# Patient Record
Sex: Female | Born: 1985 | Race: White | Hispanic: No | Marital: Married | Smoking: Never smoker
Health system: Southern US, Community
[De-identification: ages and names within clinical notes are randomized; demographics above are authoritative.]

## PROBLEM LIST (undated history)

## (undated) DIAGNOSIS — N915 Oligomenorrhea, unspecified: Secondary | ICD-10-CM

## (undated) DIAGNOSIS — A64 Unspecified sexually transmitted disease: Secondary | ICD-10-CM

## (undated) DIAGNOSIS — G43909 Migraine, unspecified, not intractable, without status migrainosus: Secondary | ICD-10-CM

## (undated) DIAGNOSIS — R87619 Unspecified abnormal cytological findings in specimens from cervix uteri: Secondary | ICD-10-CM

## (undated) HISTORY — DX: Oligomenorrhea, unspecified: N91.5

## (undated) HISTORY — DX: Unspecified abnormal cytological findings in specimens from cervix uteri: R87.619

## (undated) HISTORY — DX: Migraine, unspecified, not intractable, without status migrainosus: G43.909

## (undated) HISTORY — DX: Unspecified sexually transmitted disease: A64

## (undated) SURGERY — Surgical Case
Anesthesia: Epidural

---

## 2012-06-05 HISTORY — PX: COLPOSCOPY: SHX161

## 2013-03-31 ENCOUNTER — Ambulatory Visit (INDEPENDENT_AMBULATORY_CARE_PROVIDER_SITE_OTHER): Payer: BC Managed Care – PPO | Admitting: Certified Nurse Midwife

## 2013-03-31 ENCOUNTER — Encounter: Payer: Self-pay | Admitting: Certified Nurse Midwife

## 2013-03-31 VITALS — BP 100/64 | HR 72 | Resp 16 | Ht 61.25 in | Wt 142.0 lb

## 2013-03-31 DIAGNOSIS — Z309 Encounter for contraceptive management, unspecified: Secondary | ICD-10-CM

## 2013-03-31 DIAGNOSIS — Z01419 Encounter for gynecological examination (general) (routine) without abnormal findings: Secondary | ICD-10-CM

## 2013-03-31 DIAGNOSIS — Z0000 Encounter for general adult medical examination without abnormal findings: Secondary | ICD-10-CM

## 2013-03-31 DIAGNOSIS — R87619 Unspecified abnormal cytological findings in specimens from cervix uteri: Secondary | ICD-10-CM | POA: Insufficient documentation

## 2013-03-31 MED ORDER — DROSPIRENONE-ETHINYL ESTRADIOL 3-0.03 MG PO TABS: 1.0000 | ORAL_TABLET | Freq: Every day | ORAL | Status: DC

## 2013-03-31 NOTE — Patient Instructions (Signed)
General topics  Next pap or exam is  due in 1 year Take a Women's multivitamin Take 1200 mg. of calcium daily - prefer dietary If any concerns in interim to call back  Breast Self-Awareness Practicing breast self-awareness may pick up problems early, prevent significant medical complications, and possibly save your life. By practicing breast self-awareness, you can become familiar with how your breasts look and feel and if your breasts are changing. This allows you to notice changes early. It can also offer you some reassurance that your breast health is good. One way to learn what is normal for your breasts and whether your breasts are changing is to do a breast self-exam. If you find a lump or something that was not present in the past, it is best to contact your caregiver right away. Other findings that should be evaluated by your caregiver include nipple discharge, especially if it is bloody; skin changes or reddening; areas where the skin seems to be pulled in (retracted); or new lumps and bumps. Breast pain is seldom associated with cancer (malignancy), but should also be evaluated by a caregiver. BREAST SELF-EXAM The best time to examine your breasts is 5 7 days after your menstrual period is over.  ExitCare Patient Information 2013 ExitCare, LLC.   Exercise to Stay Healthy Exercise helps you become and stay healthy. EXERCISE IDEAS AND TIPS Choose exercises that:  You enjoy.  Fit into your day. You do not need to exercise really hard to be healthy. You can do exercises at a slow or medium level and stay healthy. You can:  Stretch before and after working out.  Try yoga, Pilates, or tai chi.  Lift weights.  Walk fast, swim, jog, run, climb stairs, bicycle, dance, or rollerskate.  Take aerobic classes. Exercises that burn about 150 calories:  Running 1  miles in 15 minutes.  Playing volleyball for 45 to 60 minutes.  Washing and waxing a car for 45 to 60  minutes.  Playing touch football for 45 minutes.  Walking 1  miles in 35 minutes.  Pushing a stroller 1  miles in 30 minutes.  Playing basketball for 30 minutes.  Raking leaves for 30 minutes.  Bicycling 5 miles in 30 minutes.  Walking 2 miles in 30 minutes.  Dancing for 30 minutes.  Shoveling snow for 15 minutes.  Swimming laps for 20 minutes.  Walking up stairs for 15 minutes.  Bicycling 4 miles in 15 minutes.  Gardening for 30 to 45 minutes.  Jumping rope for 15 minutes.  Washing windows or floors for 45 to 60 minutes. Document Released: 04/06/2010 Document Revised: 05/27/2011 Document Reviewed: 04/06/2010 ExitCare Patient Information 2013 ExitCare[employer] LLC.   Other topics ( that may be useful information):    Sexually Transmitted Disease Sexually transmitted disease (STD) refers to any infection that is passed from person to person during sexual activity. This may happen by way of saliva, semen, blood, vaginal mucus, or urine. Common STDs include:  Gonorrhea.  Chlamydia.  Syphilis.  HIV/AIDS.  Genital herpes.  Hepatitis B and C.  Trichomonas.  Human papillomavirus (HPV).  Pubic lice. CAUSES  An STD may be spread by bacteria, virus, or parasite. A person can get an STD by:  Sexual intercourse with an infected person.  Sharing sex toys with an infected person.  Sharing needles with an infected person.  Having intimate contact with the genitals, mouth, or rectal areas of an infected person. SYMPTOMS  Some people may not have any symptoms, but   they can still pass the infection to others. Different STDs have different symptoms. Symptoms include:  Painful or bloody urination.  Pain in the pelvis, abdomen, vagina, anus, throat, or eyes.  Skin rash, itching, irritation, growths, or sores (lesions). These usually occur in the genital or anal area.  Abnormal vaginal discharge.  Penile discharge in men.  Soft, flesh-colored skin growths in the  genital or anal area.  Fever.  Pain or bleeding during sexual intercourse.  Swollen glands in the groin area.  Yellow skin and eyes (jaundice). This is seen with hepatitis. DIAGNOSIS  To make a diagnosis, your caregiver may:  Take a medical history.  Perform a physical exam.  Take a specimen (culture) to be examined.  Examine a sample of discharge under a microscope.  Perform blood test TREATMENT   Chlamydia, gonorrhea, trichomonas, and syphilis can be cured with antibiotic medicine.  Genital herpes, hepatitis, and HIV can be treated, but not cured, with prescribed medicines. The medicines will lessen the symptoms.  Genital warts from HPV can be treated with medicine or by freezing, burning (electrocautery), or surgery. Warts may come back.  HPV is a virus and cannot be cured with medicine or surgery.However, abnormal areas may be followed very closely by your caregiver and may be removed from the cervix, vagina, or vulva through office procedures or surgery. If your diagnosis is confirmed, your recent sexual partners need treatment. This is true even if they are symptom-free or have a negative culture or evaluation. They should not have sex until their caregiver says it is okay. HOME CARE INSTRUCTIONS  All sexual partners should be informed, tested, and treated for all STDs.  Take your antibiotics as directed. Finish them even if you start to feel better.  Only take over-the-counter or prescription medicines for pain, discomfort, or fever as directed by your caregiver.  Rest.  Eat a balanced diet and drink enough fluids to keep your urine clear or pale yellow.  Do not have sex until treatment is completed and you have followed up with your caregiver. STDs should be checked after treatment.  Keep all follow-up appointments, Pap tests, and blood tests as directed by your caregiver.  Only use latex condoms and water-soluble lubricants during sexual activity. Do not use  petroleum jelly or oils.  Avoid alcohol and illegal drugs.  Get vaccinated for HPV and hepatitis. If you have not received these vaccines in the past, talk to your caregiver about whether one or both might be right for you.  Avoid risky sex practices that can break the skin. The only way to avoid getting an STD is to avoid all sexual activity.Latex condoms and dental dams (for oral sex) will help lessen the risk of getting an STD, but will not completely eliminate the risk. SEEK MEDICAL CARE IF:   You have a fever.  You have any new or worsening symptoms. Document Released: 05/25/2002 Document Revised: 05/27/2011 Document Reviewed: 06/01/2010 [facility] Patient Information 2013 [employer].    Domestic Abuse You are being battered or abused if someone close to you hits, pushes, or physically hurts you in any way. You also are being abused if you are forced into activities. You are being sexually abused if you are forced to have sexual contact of any kind. You are being emotionally abused if you are made to feel worthless or if you are constantly threatened. It is important to remember that help is available. No one has the right to abuse you. PREVENTION OF FURTHER  ABUSE  Learn the warning signs of danger. This varies with situations but may include: the use of alcohol, threats, isolation from friends and family, or forced sexual contact. Leave if you feel that violence is going to occur.  If you are attacked or beaten, report it to the police so the abuse is documented. You do not have to press charges. The police can protect you while you or the attackers are leaving. Get the officer's name and badge number and a copy of the report.  Find someone you can trust and tell them what is happening to you: your caregiver, a nurse, clergy member, close friend or family member. Feeling ashamed is natural, but remember that you have done nothing wrong. No one deserves abuse. Document Released:  03/01/2000 Document Revised: 05/27/2011 Document Reviewed: 05/10/2010 ExitCare Patient Information 2013 ExitCare[employer] LLC.    How Much is Too Much Alcohol? Drinking too much alcohol can cause injury, accidents, and health problems. These types of problems can include:   Car crashes.  Falls.  Family fighting (domestic violence).  Drowning.  Fights.  Injuries.  Burns.  Damage to certain organs.  Having a baby with birth defects. ONE DRINK CAN BE TOO MUCH WHEN YOU ARE:  Working.  Pregnant or breastfeeding.  Taking medicines. Ask your doctor.  Driving or planning to drive. If you or someone you know has a drinking problem, get help from a doctor.  Document Released: 12/29/2008 Document Revised: 05/27/2011 Document Reviewed: 12/29/2008 ExitCare Patient Information 2013 ExitCare, LLC.   Smoking Hazards Smoking cigarettes is extremely bad for your health. Tobacco smoke has over 200 known poisons in it. There are over 60 chemicals in tobacco smoke that cause cancer. Some of the chemicals found in cigarette smoke include:   Cyanide.  Benzene.  Formaldehyde.  Methanol (wood alcohol).  Acetylene (fuel used in welding torches).  Ammonia. Cigarette smoke also contains the poisonous gases nitrogen oxide and carbon monoxide.  Cigarette smokers have an increased risk of many serious medical problems and Smoking causes approximately:  90% of all lung cancer deaths in men.  80% of all lung cancer deaths in women.  90% of deaths from chronic obstructive lung disease. Compared with nonsmokers, smoking increases the risk of:  Coronary heart disease by 2 to 4 times.  Stroke by 2 to 4 times.  Men developing lung cancer by 23 times.  Women developing lung cancer by 13 times.  Dying from chronic obstructive lung diseases by 12 times.  . Smoking is the most preventable cause of death and disease in our society.  WHY IS SMOKING ADDICTIVE?  Nicotine is the chemical  agent in tobacco that is capable of causing addiction or dependence.  When you smoke and inhale, nicotine is absorbed rapidly into the bloodstream through your lungs. Nicotine absorbed through the lungs is capable of creating a powerful addiction. Both inhaled and non-inhaled nicotine may be addictive.  Addiction studies of cigarettes and spit tobacco show that addiction to nicotine occurs mainly during the teen years, when young people begin using tobacco products. WHAT ARE THE BENEFITS OF QUITTING?  There are many health benefits to quitting smoking.   Likelihood of developing cancer and heart disease decreases. Health improvements are seen almost immediately.  Blood pressure, pulse rate, and breathing patterns start returning to normal soon after quitting. QUITTING SMOKING   American Lung Association - 1-800-LUNGUSA  American Cancer Society - 1-800-ACS-2345 Document Released: 04/11/2004 Document Revised: 05/27/2011 Document Reviewed: 12/14/2008 ExitCare Patient Information 2013 ExitCare,   LLC.   Stress Management Stress is a state of physical or mental tension that often results from changes in your life or normal routine. Some common causes of stress are:  Death of a loved one.  Injuries or severe illnesses.  Getting fired or changing jobs.  Moving into a new home. Other causes may be:  Sexual problems.  Business or financial losses.  Taking on a large debt.  Regular conflict with someone at home or at work.  Constant tiredness from lack of sleep. It is not just bad things that are stressful. It may be stressful to:  Win the lottery.  Get married.  Buy a new car. The amount of stress that can be easily tolerated varies from person to person. Changes generally cause stress, regardless of the types of change. Too much stress can affect your health. It may lead to physical or emotional problems. Too little stress (boredom) may also become stressful. SUGGESTIONS TO  REDUCE STRESS:  Talk things over with your family and friends. It often is helpful to share your concerns and worries. If you feel your problem is serious, you may want to get help from a professional counselor.  Consider your problems one at a time instead of lumping them all together. Trying to take care of everything at once may seem impossible. List all the things you need to do and then start with the most important one. Set a goal to accomplish 2 or 3 things each day. If you expect to do too many in a single day you will naturally fail, causing you to feel even more stressed.  Do not use alcohol or drugs to relieve stress. Although you may feel better for a short time, they do not remove the problems that caused the stress. They can also be habit forming.  Exercise regularly - at least 3 times per week. Physical exercise can help to relieve that "uptight" feeling and will relax you.  The shortest distance between despair and hope is often a good night's sleep.  Go to bed and get up on time allowing yourself time for appointments without being rushed.  Take a short "time-out" period from any stressful situation that occurs during the day. Close your eyes and take some deep breaths. Starting with the muscles in your face, tense them, hold it for a few seconds, then relax. Repeat this with the muscles in your neck, shoulders, hand, stomach, back and legs.  Take good care of yourself. Eat a balanced diet and get plenty of rest.  Schedule time for having fun. Take a break from your daily routine to relax. HOME CARE INSTRUCTIONS   Call if you feel overwhelmed by your problems and feel you can no longer manage them on your own.  Return immediately if you feel like hurting yourself or someone else. Document Released: 08/28/2000 Document Revised: 05/27/2011 Document Reviewed: 04/20/2007 ExitCare Patient Information 2013 ExitCare, LLC.   

## 2013-03-31 NOTE — Progress Notes (Signed)
28 y.o. G0P0000 Single Caucasian Fe here for annual exam.  Periods normal, no issues. Contraception working well. No STD concerns or testing needed. 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 Patient had abnormal in 2013 with pap to be done in one year. Pap repeated with ASCUS + HPVHR with colpo which ECC only with negative results. Patient will be moving and aware pap needs to be done 2/15, so will do today. Denies vaginal concerns or discharge. No other health issues today.  Patient's last menstrual period was 03/21/2013.          Sexually active: yes  The current method of family planning is OCP (estrogen/progesterone).    Exercising: yes  cardio Smoker:  no  Health Maintenance: Pap:  7/13, pap,2/14,77mth pap 10/14 , no aex since 7/13 it looks like MMG:  none Colonoscopy:  none BMD:   none TDaP:  Up to date Labs: none Self breast exam: done occ   reports that she has never smoked. She does not have any smokeless tobacco history on file. She reports that she drinks alcohol. She reports that she does not use illicit drugs.  Past Medical History  Diagnosis Date  . Migraines   . Abnormal Pap smear of cervix     2013,2014 HPV pos  . STD (sexually transmitted disease)     HPV    Past Surgical History  Procedure Laterality Date  . Colposcopy  2013    Current Outpatient Prescriptions  Medication Sig Dispense Refill  . drospirenone-ethinyl estradiol (YASMIN,ZARAH,SYEDA) 3-0.03 MG tablet Take 1 tablet by mouth daily.       No current facility-administered medications for this visit.    Family History  Problem Relation Age of Onset  . Cancer Mother     squamous cell carcinoma on her tongue  . Diabetes Paternal Uncle   . Diabetes Paternal Grandmother   . Stroke Paternal Grandmother   . Diabetes Paternal Grandfather     ROS:  Pertinent items are noted in HPI.  Otherwise, a comprehensive ROS was negative.  Exam:   BP 100/64  Pulse 72  Resp 16  Ht 5' 1.25" (1.556 m)   Wt 142 lb (64.411 kg)  BMI 26.60 kg/m2  LMP 03/21/2013 Height: 5' 1.25" (155.6 cm)  Ht Readings from Last 3 Encounters:  03/31/13 5' 1.25" (1.556 m)    General appearance: alert, cooperative and appears stated age Head: Normocephalic, without obvious abnormality, atraumatic Neck: no adenopathy, supple, symmetrical, trachea midline and thyroid normal to inspection and palpation Lungs: clear to auscultation bilaterally Breasts: normal appearance, no masses or tenderness, No nipple retraction or dimpling, No nipple discharge or bleeding, No axillary or supraclavicular adenopathy, Taught monthly breast self examination Heart: regular rate and rhythm Abdomen: soft, non-tender; no masses,  no organomegaly Extremities: extremities normal, atraumatic, no cyanosis or edema Skin: Skin color, texture, turgor normal. No rashes or lesions Lymph nodes: Cervical, supraclavicular, and axillary nodes normal. No abnormal inguinal nodes palpated Neurologic: Grossly normal   Pelvic: External genitalia:  no lesions              Urethra:  normal appearing urethra with no masses, tenderness or lesions              Bartholin's and Skene's: normal                 Vagina: normal appearing vagina with normal color and discharge, no lesions  Cervix: normal appearance, non tender              Pap taken: yes Bimanual Exam:  Uterus:  normal size, contour, position, consistency, mobility, non-tender and anteflexed              Adnexa: normal adnexa and no mass, fullness, tenderness               Rectovaginal: Confirms               Anus:  normal sphincter tone, no lesions  A:  Well Woman with normal exam  Contraception OCP desired  History of abnormal pap ASCUS + HPVHR, Colpo negative ECC   P:   Reviewed health and wellness pertinent to exam  Rx Yasmin see order  Pap smear as per guidelines   pap smear taken today with HPV reflex if negative repeat in one year if no per results patient  aware  counseled on breast self exam, STD prevention, HIV risk factors and prevention, use and side effects of OCP's, adequate intake of calcium and vitamin D, diet and exercise  return annually or prn  An After Visit Summary was printed and given to the patient.

## 2013-04-01 LAB — IPS PAP TEST WITH REFLEX TO HPV

## 2013-04-02 NOTE — Progress Notes (Signed)
Reviewed personally.  M. Suzanne Marybeth Dandy, MD.  

## 2013-10-15 ENCOUNTER — Ambulatory Visit (INDEPENDENT_AMBULATORY_CARE_PROVIDER_SITE_OTHER): Payer: BC Managed Care – PPO | Admitting: Physician Assistant

## 2013-10-15 VITALS — BP 118/72 | HR 84 | Temp 98.5°F | Resp 16 | Ht 61.25 in | Wt 146.4 lb

## 2013-10-15 DIAGNOSIS — Z111 Encounter for screening for respiratory tuberculosis: Secondary | ICD-10-CM

## 2013-10-15 DIAGNOSIS — Z23 Encounter for immunization: Secondary | ICD-10-CM

## 2013-10-15 NOTE — Progress Notes (Signed)

## 2013-10-15 NOTE — Progress Notes (Signed)
   Subjective:    Patient ID: Amber Graves, female    DOB: December 24, 1985, 28 y.o.   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  She is UTD on all other immunizations. Patient is heathy with no other concerns today.   See TB screening questionnaire.   Review of Systems  Constitutional: Negative for fever and chills.  Respiratory: Negative for cough.   Gastrointestinal: Negative for nausea and vomiting.  Neurological: Negative for headaches.       Objective:   Physical Exam  Constitutional: She is oriented to person, place, and time. She appears well-developed and well-nourished.  HENT:  Head: Normocephalic and atraumatic.  Right Ear: External ear normal.  Left Ear: External ear normal.  Eyes: Conjunctivae are normal.  Neck: Normal range of motion.  Cardiovascular: Normal rate.   Pulmonary/Chest: Effort normal.  Neurological: She is alert and oriented to person, place, and time.  Psychiatric: She has a normal mood and affect. Her behavior is normal. Judgment and thought content normal.          Assessment & Plan:  Screening for tuberculosis - Plan: TB Skin Test  Need for prophylactic vaccination and inoculation against viral hepatitis - Plan: Hepatitis A vaccine adult IM  TB test placed.  Hep A #1 given Follow up as needed.

## 2013-10-17 ENCOUNTER — Telehealth: Payer: Self-pay | Admitting: *Deleted

## 2013-10-17 ENCOUNTER — Ambulatory Visit (INDEPENDENT_AMBULATORY_CARE_PROVIDER_SITE_OTHER): Payer: BC Managed Care – PPO | Admitting: *Deleted

## 2013-10-17 DIAGNOSIS — Z111 Encounter for screening for respiratory tuberculosis: Secondary | ICD-10-CM

## 2013-10-17 LAB — TB SKIN TEST
Induration: 0 mm
TB Skin Test: NEGATIVE

## 2014-01-12 ENCOUNTER — Ambulatory Visit (INDEPENDENT_AMBULATORY_CARE_PROVIDER_SITE_OTHER): Payer: Self-pay | Admitting: Family Medicine

## 2014-01-12 VITALS — BP 118/80 | HR 95 | Temp 99.0°F | Resp 16 | Ht 61.25 in | Wt 146.0 lb

## 2014-01-12 DIAGNOSIS — Z111 Encounter for screening for respiratory tuberculosis: Secondary | ICD-10-CM

## 2014-01-12 NOTE — Progress Notes (Signed)

## 2014-01-12 NOTE — Progress Notes (Signed)
   Subjective:    Patient ID: Amber Graves, female    DOB: May 07, 1985, 28 y.o.   MRN: [id number] This chart was scribed for Elvina SidleKurt Deiona Hooper, MD by Jolene Provostobert Halas, Medical Scribe. This patient was seen in Room 10 and the patient's care was started at 8:41 AM.  HPI HPI Comments: [place] is a 28 y.o. female just needing a TB test.   She does not need a doctors visit.   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

## 2014-01-14 ENCOUNTER — Ambulatory Visit (INDEPENDENT_AMBULATORY_CARE_PROVIDER_SITE_OTHER): Payer: Self-pay | Admitting: Radiology

## 2014-01-14 DIAGNOSIS — Z111 Encounter for screening for respiratory tuberculosis: Secondary | ICD-10-CM

## 2014-01-14 LAB — TB SKIN TEST
Induration: 0 mm
TB Skin Test: NEGATIVE

## 2014-04-01 ENCOUNTER — Telehealth: Payer: Self-pay | Admitting: Certified Nurse Midwife

## 2014-04-01 ENCOUNTER — Other Ambulatory Visit: Payer: Self-pay | Admitting: Certified Nurse Midwife

## 2014-04-01 NOTE — Telephone Encounter (Signed)
Patient is returning a call to Reina. °

## 2014-04-01 NOTE — Telephone Encounter (Signed)
Medication refill request: Yasmin  Last AEX:  03/31/13 Next AEX: Not scheduled  Last MMG (if hormonal medication request): N/A Refill authorized: 03/31/13 #1pack/12RF. Today #1pack/0R?  LM for pt to call back to schedule AEX.

## 2014-04-01 NOTE — Telephone Encounter (Signed)
Called pt back to schedule AEX. She states she will call back in the beginning of February bc she in between health insurances.  - Advised pt that Mrs Eunice BlaseDebbie sent 1 month supply and that she just needs to schedule her AEX before that. - Pt verbalized understanding.  Mrs Newt MinionDebbie FYI - Encounter closed.

## 2014-04-01 NOTE — Telephone Encounter (Signed)
Yes no refill

## 2014-04-27 ENCOUNTER — Other Ambulatory Visit: Payer: Self-pay | Admitting: *Deleted

## 2014-04-27 MED ORDER — DROSPIRENONE-ETHINYL ESTRADIOL 3-0.03 MG PO TABS: 1.0000 | ORAL_TABLET | Freq: Every day | ORAL | Status: DC

## 2014-04-27 NOTE — Telephone Encounter (Signed)
Incoming [facility] from [facility] requesting Ocella 3-0.03 mg tab  Medication refill request: Ocella Last AEX:  03/31/13 Next AEX: Not scheduled  Last MMG (if hormonal medication request): none Refill authorized: 04/01/14 #28/0R. Today: denied?   Dion BodyReina C Beltran, CMA at 04/01/2014 4:46 PM     Status: Signed       Expand All Collapse All   Called pt back to schedule AEX. She states she will call back in the beginning of February bc she in between health insurances.  - Advised pt that Mrs Eunice BlaseDebbie sent 1 month supply and that she just needs to schedule her AEX before that. - Pt verbalized understanding.  Mrs Newt MinionDebbie FYI - Encounter closed.

## 2014-04-28 NOTE — Telephone Encounter (Signed)
Fax faxed back to [facility] stating that #28/0 rfs was sent electronically to pharmacy.

## 2015-07-04 ENCOUNTER — Encounter: Payer: Self-pay | Admitting: Emergency Medicine

## 2015-07-04 ENCOUNTER — Emergency Department
Admission: EM | Admit: 2015-07-04 | Discharge: 2015-07-04 | Disposition: A | Discharge status: Home / Self Care | Payer: Managed Care, Other (non HMO) | Attending: Emergency Medicine | Admitting: Emergency Medicine

## 2015-07-04 ENCOUNTER — Emergency Department: Payer: Managed Care, Other (non HMO)

## 2015-07-04 DIAGNOSIS — R1030 Lower abdominal pain, unspecified: Secondary | ICD-10-CM | POA: Diagnosis present

## 2015-07-04 DIAGNOSIS — Z79899 Other long term (current) drug therapy: Secondary | ICD-10-CM | POA: Diagnosis not present

## 2015-07-04 DIAGNOSIS — I880 Nonspecific mesenteric lymphadenitis: Secondary | ICD-10-CM | POA: Diagnosis not present

## 2015-07-04 LAB — COMPREHENSIVE METABOLIC PANEL
ALT: 31 U/L (ref 14–54)
AST: 30 U/L (ref 15–41)
Albumin: 4.2 g/dL (ref 3.5–5.0)
Alkaline Phosphatase: 44 U/L (ref 38–126)
Anion gap: 8 (ref 5–15)
BUN: 7 mg/dL (ref 6–20)
CHLORIDE: 103 mmol/L (ref 101–111)
CO2: 29 mmol/L (ref 22–32)
CREATININE: 0.6 mg/dL (ref 0.44–1.00)
Calcium: 8.9 mg/dL (ref 8.9–10.3)
Glucose, Bld: 95 mg/dL (ref 65–99)
Potassium: 3.4 mmol/L — ABNORMAL LOW (ref 3.5–5.1)
Sodium: 140 mmol/L (ref 135–145)
TOTAL PROTEIN: 7 g/dL (ref 6.5–8.1)
Total Bilirubin: 1 mg/dL (ref 0.3–1.2)

## 2015-07-04 LAB — URINALYSIS COMPLETE WITH MICROSCOPIC (ARMC ONLY)
BILIRUBIN URINE: NEGATIVE
Glucose, UA: NEGATIVE mg/dL
HGB URINE DIPSTICK: NEGATIVE
LEUKOCYTES UA: NEGATIVE
NITRITE: NEGATIVE
PH: 6 (ref 5.0–8.0)
Protein, ur: NEGATIVE mg/dL
SPECIFIC GRAVITY, URINE: 1.005 (ref 1.005–1.030)

## 2015-07-04 LAB — CBC
HCT: 40.6 % (ref 35.0–47.0)
Hemoglobin: 14 g/dL (ref 12.0–16.0)
MCH: 31.3 pg (ref 26.0–34.0)
MCHC: 34.5 g/dL (ref 32.0–36.0)
MCV: 90.7 fL (ref 80.0–100.0)
PLATELETS: 172 10*3/uL (ref 150–440)
RBC: 4.48 MIL/uL (ref 3.80–5.20)
RDW: 12.2 % (ref 11.5–14.5)
WBC: 4.3 10*3/uL (ref 3.6–11.0)

## 2015-07-04 LAB — POCT PREGNANCY, URINE: Preg Test, Ur: NEGATIVE

## 2015-07-04 LAB — LIPASE, BLOOD: LIPASE: 14 U/L (ref 11–51)

## 2015-07-04 MED ORDER — ONDANSETRON HCL 4 MG PO TABS: 4.0000 mg | ORAL_TABLET | Freq: Three times a day (TID) | ORAL | Status: DC | PRN

## 2015-07-04 MED ORDER — IOPAMIDOL (ISOVUE-300) INJECTION 61%
100.0000 mL | Freq: Once | INTRAVENOUS | Status: AC | PRN
  Administered 2015-07-04: 100 mL via INTRAVENOUS
  Filled 2015-07-04: qty 100

## 2015-07-04 MED ORDER — DIATRIZOATE MEGLUMINE & SODIUM 66-10 % PO SOLN
15.0000 mL | Freq: Once | ORAL | Status: AC
  Administered 2015-07-04: 15 mL via ORAL

## 2015-07-04 MED ORDER — DICYCLOMINE HCL 20 MG PO TABS: 20.0000 mg | ORAL_TABLET | Freq: Three times a day (TID) | ORAL | Status: DC | PRN

## 2015-07-04 NOTE — ED Notes (Signed)
Patient transported to CT via stretcher.

## 2015-07-04 NOTE — ED Notes (Signed)
Discharge instructions reviewed with patient. Patient verbalized understanding. Patient ambulated to lobby without difficulty.   

## 2015-07-04 NOTE — ED Notes (Signed)
Patient ambulatory to triage with steady gait, without difficulty or distress noted; pt reports right lower abd pain since Sunday accomp by N/V/D and bloating

## 2015-07-04 NOTE — ED Notes (Signed)
Pt reports RLQ abd pain. Reports pain started as generalized abd pain starting Sunday that then focused in her RLQ. Had N/V/D Sunday that was just N/V the past two days, no N/V today, but lack of appetite today. No abd tenderness, pt in NAD. Pt hypertensive in triage and in room, reports no hx of HTN.

## 2015-07-04 NOTE — ED Provider Notes (Signed)
Time Seen: Approximately 2030 I have reviewed the triage notes  Chief Complaint: Abdominal Pain   History of Present Illness: Amber Graves is a 30 y.o. female who presents with a 2 day history of some nausea, vomiting and loose stool. She states her symptoms started on Sunday and she started developing some diffuse lower abdominal pain. She states the nausea and vomiting have decreased along with the diarrhea though she still has a decreased appetite. Had some pain in the periumbilical to right lower quadrant region. She denies any fever or back pain. She denies any dysuria, hematuria or urinary frequency. She has not traveled recently is not currently on antibiotic therapy.  Past Medical History  Diagnosis Date  . Migraines   . Abnormal Pap smear of cervix     09-17-2011 ASCUS +HPV,12/2012 ASC-US  . STD (sexually transmitted disease)     HPV    Patient Active Problem List   Diagnosis Date Noted  . Abnormal Pap smear of cervix 03/31/2013    Class: History of    Past Surgical History  Procedure Laterality Date  . Colposcopy  06-05-2012    ECC neg    Past Surgical History  Procedure Laterality Date  . Colposcopy  06-05-2012    ECC neg    Current Outpatient Rx  Name  Route  Sig  Dispense  Refill  . dicyclomine (BENTYL) 20 MG tablet   Oral   Take 1 tablet (20 mg total) by mouth 3 (three) times daily as needed for spasms.   30 tablet   0   . drospirenone-ethinyl estradiol (OCELLA) 3-0.03 MG tablet   Oral   Take 1 tablet by mouth daily.   28 tablet   0   . ondansetron (ZOFRAN) 4 MG tablet   Oral   Take 1 tablet (4 mg total) by mouth every 8 (eight) hours as needed for nausea or vomiting.   21 tablet   0     Allergies:  Review of patient's allergies indicates no known allergies.  Family History: Family History  Problem Relation Age of Onset  . Cancer Mother     squamous cell carcinoma on her tongue  . Diabetes Paternal Uncle   . Diabetes Paternal  Grandmother   . Stroke Paternal Grandmother   . Diabetes Paternal Grandfather     Social History: Social History  Substance Use Topics  . Smoking status: Never Smoker   . Smokeless tobacco: Never Used  . Alcohol Use: 0.0 - 0.5 oz/week    0-1 drink(s) per week     Review of Systems:   10 point review of systems was performed and was otherwise negative:  Constitutional: No fever Eyes: No visual disturbances ENT: No sore throat, ear pain Cardiac: No chest pain Respiratory: No shortness of breath, wheezing, or stridor Abdomen: She points mainly toward her right lower quadrant area and states the vomiting and diarrhea have improved. She denies any lower hematochezia  Endocrine: No weight loss, No night sweats Extremities: No peripheral edema, cyanosis Skin: No rashes, easy bruising Neurologic: No focal weakness, trouble with speech or swollowing Urologic: No dysuria, Hematuria, or urinary frequency   Physical Exam:  ED Triage Vitals  Enc Vitals Group     BP 07/04/15 1926 116/100 mmHg     Pulse Rate 07/04/15 1926 93     Resp 07/04/15 1926 20     Temp 07/04/15 1926 98.1 F (36.7 C)     Temp Source 07/04/15 1926 Oral  SpO2 07/04/15 1926 100 %     Weight 07/04/15 1926 160 lb (72.576 kg)     Height 07/04/15 1926  (1.549 m)     Head Cir --      Peak Flow --      Pain Score 07/04/15 1926 3     Pain Loc --      Pain Edu? --      Excl. in GC? --     General: Awake , Alert , and Oriented times 3; GCS 15 Head: Normal cephalic , atraumatic Eyes: Pupils equal , round, reactive to light Nose/Throat: No nasal drainage, patent upper airway without erythema or exudate.  Neck: Supple, Full range of motion, No anterior adenopathy or palpable thyroid masses Lungs: Clear to ascultation without wheezes , rhonchi, or rales Heart: Regular rate, regular rhythm without murmurs , gallops , or rubs Abdomen: Mild tenderness in the right lower quadrant without rebound, guarding , or  rigidity; bowel sounds positive and symmetric in all 4 quadrants. No organomegaly .        Extremities: 2 plus symmetric pulses. No edema, clubbing or cyanosis Neurologic: normal ambulation, Motor symmetric without deficits, sensory intact Skin: warm, dry, no rashes   Labs:   All laboratory work was reviewed including any pertinent negatives or positives listed below:  Labs Reviewed  COMPREHENSIVE METABOLIC PANEL - Abnormal; Notable for the following:    Potassium 3.4 (*)    All other components within normal limits  URINALYSIS COMPLETEWITH MICROSCOPIC ([facility] ONLY) - Abnormal; Notable for the following:    Color, Urine YELLOW (*)    APPearance CLEAR (*)    Ketones, ur TRACE (*)    Bacteria, UA RARE (*)    Squamous Epithelial / LPF 0-5 (*)    All other components within normal limits  LIPASE, BLOOD  CBC  POCT PREGNANCY, URINE   review of laboratory work showed no significant abnormalities  Radiology:   CLINICAL DATA: Acute right lower quadrant abdominal pain.  EXAM: CT ABDOMEN AND PELVIS WITH CONTRAST  TECHNIQUE: Multidetector CT imaging of the abdomen and pelvis was performed using the standard protocol following bolus administration of intravenous contrast.  CONTRAST: ISOVUE-300 IOPAMIDOL (ISOVUE-300) INJECTION 61%  COMPARISON: None.  FINDINGS: Visualized lung bases are unremarkable. No significant osseous abnormality is noted.  No gallstones are noted. The liver, spleen and pancreas are unremarkable. There is no evidence of bowel obstruction. The visualized appendix appears normal. Urinary bladder appears normal. Uterus and ovaries are unremarkable. Mild amount of free fluid is noted in the pelvis which most likely is physiologic. Urinary bladder appears normal. Mildly enlarged mesenteric lymph nodes are noted, most likely inflammatory or reactive in etiology.  IMPRESSION: Mildly enlarged mesenteric lymph nodes are noted with mild inflammatory  stranding present most consistent with inflammatory or reactive etiology. No other significant abnormality seen in the abdomen or pelvis.    I personally reviewed the radiologic studies    ED Course:  Patient's stay here was uneventful and does not appear that she has a surgical abdomen at this time. Her CAT scan is negative and I felt we could stop the pursuit for acute appendicitis. They do see some mesenteric adenitis which would be consistent with her viral gastroenteritis history.   Assessment: * Mesenteric adenitis  Final Clinical Impression:  * Final diagnoses:  Mesenteric adenitis     Plan:  Outpatient management Patient was advised to return immediately if condition worsens. Patient was advised to follow up with  their primary care physician or other specialized physicians involved in their outpatient care. The patient and/or family member/power of attorney had laboratory results reviewed at the bedside. All questions and concerns were addressed and appropriate discharge instructions were distributed by the nursing staff.            Jennye Moccasin, MD 07/04/15 [phone]

## 2015-08-07 ENCOUNTER — Ambulatory Visit (INDEPENDENT_AMBULATORY_CARE_PROVIDER_SITE_OTHER): Payer: Managed Care, Other (non HMO) | Admitting: Family Medicine

## 2015-08-07 ENCOUNTER — Encounter: Payer: Self-pay | Admitting: Family Medicine

## 2015-08-07 VITALS — BP 100/60 | HR 80 | Ht 61.0 in | Wt 161.0 lb

## 2015-08-07 DIAGNOSIS — N938 Other specified abnormal uterine and vaginal bleeding: Secondary | ICD-10-CM | POA: Diagnosis not present

## 2015-08-07 NOTE — Patient Instructions (Signed)

## 2015-08-07 NOTE — Progress Notes (Signed)
Name: Amber ErikssonBrittany Watlington   MRN: [id number]    DOB: 1985-11-24   Date:08/07/2015       Progress Note  Subjective  Chief Complaint  Chief Complaint  Patient presents with  . Establish Care  . abnormal menstrual cycle    been off birth control x 6 months- having irregular periods- referral to GYN    HPI Comments: Patient present for establish primary care physician.    No problem-specific assessment & plan notes found for this encounter.   Past Medical History  Diagnosis Date  . Migraines   . Abnormal Pap smear of cervix     09-17-2011 ASCUS +HPV,12/2012 ASC-US  . STD (sexually transmitted disease)     HPV    Past Surgical History  Procedure Laterality Date  . Colposcopy  06-05-2012    ECC neg    Family History  Problem Relation Age of Onset  . Cancer Mother     squamous cell carcinoma on her tongue  . Diabetes Paternal Uncle   . Diabetes Paternal Grandmother   . Stroke Paternal Grandmother   . Diabetes Paternal Grandfather     Social History   Social History  . Marital Status: Single    Spouse Name: N/A  . Number of Children: N/A  . Years of Education: N/A   Occupational History  . Not on file.   Social History Main Topics  . Smoking status: Never Smoker   . Smokeless tobacco: Never Used  . Alcohol Use: 0.0 - 0.5 oz/week    0-1 drink(s) per week  . Drug Use: No  . Sexual Activity:    Partners: Male    Birth Control/ Protection: Pill   Other Topics Concern  . Not on file   Social History Narrative    No Known Allergies   Review of Systems  Constitutional: Negative for fever, chills, weight loss and malaise/fatigue.  HENT: Negative for ear discharge, ear pain and sore throat.   Eyes: Negative for blurred vision.  Respiratory: Negative for cough, sputum production, shortness of breath and wheezing.   Cardiovascular: Negative for chest pain, palpitations and leg swelling.  Gastrointestinal: Negative for heartburn, nausea, abdominal pain,  diarrhea, constipation, blood in stool and melena.  Genitourinary: Negative for dysuria, urgency, frequency and hematuria.  Musculoskeletal: Negative for myalgias, back pain, joint pain and neck pain.  Skin: Negative for rash.  Neurological: Negative for dizziness, tingling, sensory change, focal weakness and headaches.  Endo/Heme/Allergies: Negative for environmental allergies and polydipsia. Does not bruise/bleed easily.  Psychiatric/Behavioral: Negative for depression and suicidal ideas. The patient is not nervous/anxious and does not have insomnia.      Objective  Filed Vitals:   08/07/15 1434  BP: 100/60  Pulse: 80  Height: 5\' 1"  (1.549 m)  Weight: 161 lb (73.029 kg)    Physical Exam  Constitutional: She is well-developed, well-nourished, and in no distress. No distress.  HENT:  Head: Normocephalic and atraumatic.  Right Ear: External ear normal.  Left Ear: External ear normal.  Nose: Nose normal.  Mouth/Throat: Oropharynx is clear and moist.  Eyes: Conjunctivae and EOM are normal. Pupils are equal, round, and reactive to light. Right eye exhibits no discharge. Left eye exhibits no discharge.  Neck: Normal range of motion. Neck supple. No JVD present. No thyromegaly present.  Cardiovascular: Normal rate, regular rhythm, normal heart sounds and intact distal pulses.  Exam reveals no gallop and no friction rub.   No murmur heard. Pulmonary/Chest: Effort normal and breath sounds normal.  Abdominal: Soft. Bowel sounds are normal. She exhibits no mass. There is no tenderness. There is no guarding.  Musculoskeletal: Normal range of motion. She exhibits no edema.  Lymphadenopathy:    She has no cervical adenopathy.  Neurological: She is alert. She has normal reflexes.  Skin: Skin is warm and dry. She is not diaphoretic.  Psychiatric: Mood and affect normal.  Nursing note and vitals reviewed.     Assessment & Plan  Problem List Items Addressed This Visit    None     Visit Diagnoses    Dysfunctional uterine bleeding    -  Primary    Relevant Orders    Ambulatory referral to Gynecology         Dr. Elizabeth Sauer [facility]  08/07/2015

## 2015-09-22 LAB — OB RESULTS CONSOLE HIV ANTIBODY (ROUTINE TESTING): HIV: NONREACTIVE

## 2015-09-22 LAB — OB RESULTS CONSOLE RPR: RPR: NONREACTIVE

## 2015-09-22 LAB — OB RESULTS CONSOLE HEPATITIS B SURFACE ANTIGEN: HEP B S AG: NEGATIVE

## 2015-09-22 LAB — OB RESULTS CONSOLE VARICELLA ZOSTER ANTIBODY, IGG: Varicella: IMMUNE

## 2015-09-22 LAB — OB RESULTS CONSOLE RUBELLA ANTIBODY, IGM: Rubella: IMMUNE

## 2016-03-19 LAB — OB RESULTS CONSOLE GC/CHLAMYDIA
Chlamydia: NEGATIVE
Gonorrhea: NEGATIVE

## 2016-03-19 LAB — OB RESULTS CONSOLE GBS: GBS: NEGATIVE

## 2016-05-16 ENCOUNTER — Encounter: Payer: Self-pay | Admitting: Advanced Practice Midwife

## 2016-05-21 ENCOUNTER — Inpatient Hospital Stay
Admission: EM | Admit: 2016-05-21 | Discharge: 2016-05-26 | DRG: 766 | Disposition: A | Discharge status: Home / Self Care | Payer: BLUE CROSS/BLUE SHIELD | Attending: Obstetrics and Gynecology | Admitting: Obstetrics and Gynecology

## 2016-05-21 DIAGNOSIS — Z833 Family history of diabetes mellitus: Secondary | ICD-10-CM

## 2016-05-21 DIAGNOSIS — Z3A41 41 weeks gestation of pregnancy: Secondary | ICD-10-CM

## 2016-05-21 DIAGNOSIS — O339 Maternal care for disproportion, unspecified: Secondary | ICD-10-CM | POA: Diagnosis present

## 2016-05-21 DIAGNOSIS — O9902 Anemia complicating childbirth: Secondary | ICD-10-CM | POA: Diagnosis present

## 2016-05-21 DIAGNOSIS — O480 Post-term pregnancy: Principal | ICD-10-CM | POA: Diagnosis present

## 2016-05-21 LAB — CBC
HCT: 39.3 % (ref 35.0–47.0)
HEMOGLOBIN: 13.6 g/dL (ref 12.0–16.0)
MCH: 30.8 pg (ref 26.0–34.0)
MCHC: 34.6 g/dL (ref 32.0–36.0)
MCV: 89 fL (ref 80.0–100.0)
Platelets: 189 10*3/uL (ref 150–440)
RBC: 4.42 MIL/uL (ref 3.80–5.20)
RDW: 14 % (ref 11.5–14.5)
WBC: 11.6 10*3/uL — ABNORMAL HIGH (ref 3.6–11.0)

## 2016-05-21 LAB — TYPE AND SCREEN
ABO/RH(D): AB POS
Antibody Screen: NEGATIVE

## 2016-05-21 MED ORDER — LIDOCAINE HCL (PF) 1 % IJ SOLN: 30.0000 mL | INTRAMUSCULAR | Status: DC | PRN

## 2016-05-21 MED ORDER — LACTATED RINGERS IV SOLN
500.0000 mL | INTRAVENOUS | Status: DC | PRN
  Administered 2016-05-22: 500 mL via INTRAVENOUS

## 2016-05-21 MED ORDER — MISOPROSTOL 200 MCG PO TABS: 800.0000 ug | ORAL_TABLET | Freq: Once | ORAL | Status: DC | PRN

## 2016-05-21 MED ORDER — TERBUTALINE SULFATE 1 MG/ML IJ SOLN: 0.2500 mg | Freq: Once | INTRAMUSCULAR | Status: DC | PRN

## 2016-05-21 MED ORDER — AMMONIA AROMATIC IN INHA: 0.3000 mL | Freq: Once | RESPIRATORY_TRACT | Status: DC | PRN

## 2016-05-21 MED ORDER — OXYTOCIN 40 UNITS IN LACTATED RINGERS INFUSION - SIMPLE MED
2.5000 [IU]/h | INTRAVENOUS | Status: DC
  Filled 2016-05-21: qty 1000

## 2016-05-21 MED ORDER — ONDANSETRON HCL 4 MG/2ML IJ SOLN: 4.0000 mg | Freq: Four times a day (QID) | INTRAMUSCULAR | Status: DC | PRN

## 2016-05-21 MED ORDER — OXYTOCIN BOLUS FROM INFUSION: 500.0000 mL | Freq: Once | INTRAVENOUS | Status: DC

## 2016-05-21 MED ORDER — DINOPROSTONE 10 MG VA INST
10.0000 mg | VAGINAL_INSERT | Freq: Once | VAGINAL | Status: AC
  Administered 2016-05-21: 10 mg via VAGINAL
  Filled 2016-05-21: qty 1

## 2016-05-21 MED ORDER — LACTATED RINGERS IV SOLN
INTRAVENOUS | Status: DC
  Administered 2016-05-21 – 2016-05-22 (×2): via INTRAVENOUS

## 2016-05-21 NOTE — H&P (Signed)
OB History & Physical   History of Present Illness:  Chief Complaint:  Presents for an induction of labor for postdates HPI:  Amber ErikssonBrittany Graves is a 31 y.o. 491P0000 female with EDC=05/15/2016 at 433w6d dated by a 9wk2d ultrasound.  Her pregnancy has been complicated by obesity. She has gained 47# with her pregnancy..  Her current BMI is 40. She presents to L&D for an IOL for postdates. Has occasional contractions. No vaginal bleeding or leakage of fluid. Baby active.   Prenatal care site: [facility]. Desires to breast feed. Contraception: minipill. TDAP UTD      Maternal Medical History:   Past Medical History:  Diagnosis Date  . Abnormal Pap smear of cervix    09-17-2011 ASCUS +HPV,12/2012 ASC-US  . Migraines   . Oligomenorrhea   . STD (sexually transmitted disease)    HPV    Past Surgical History:  Procedure Laterality Date  . COLPOSCOPY  06-05-2012   ECC neg    No Known Allergies  Prior to Admission medications   Prenatal vitamins       Social History: She  reports that she has never smoked. She has never used smokeless tobacco. She reports that she drinks alcohol. She reports that she does not use drugs.  Family History: family history includes Diabetes in her paternal grandfather, paternal grandmother, and paternal uncle; Stroke in her paternal grandmother; Tongue cancer in her mother.   Review of Systems: Negative x 10 systems reviewed except as noted in the HPI.      Physical Exam:  Vital Signs: Temp 98.6 F (37 C) (Oral)   Ht 5\' 1"  (1.549 m)   Wt 97.1 kg (214 lb)   LMP 07/19/2015 (Exact Date)   BMI 40.43 kg/m  General: comfortable, pleasant, gravid, WF, in NAD  HEENT: normocephalic,anicteric Heart: regular rate & rhythm.  No murmurs/rubs/gallops Lungs: clear to auscultation bilaterally Abdomen: soft, gravid, non-tender;  EFW: 8# Pelvic:   External: Normal external female genitalia  Cervix:1/75%/-1/posterior   Extremities:  non-tender, symmetric, mild edema bilaterally.  DTRs: +1 Neurologic: Alert & oriented x 3.    Pertinent Results:  Prenatal Labs: Blood type/Rh AB positive  Antibody screen negative  Rubella Varicella Immune immune  RPR negative  HBsAg negative  HIV Non reactive  GC negative  Chlamydia negative  Genetic screening FIRST test negative  1 hour GTT 102  3 hour GTT NA  GBS Negative  on 04/19/2016   Baseline FHR: 135-140 with accelerations to 150s to 160s, moderate variability Toco: contractions q6 min, mild  Assessment:  Amber ErikssonBrittany Graves is a 31 y.o. G1P0000 female at 4933w6d with Bishop score of 7  For induction of labor for postdates   Plan:  1. Admit to Labor & Delivery .   2. CBC, T&S, Clrs, IVF 3. GBS negative.   4. Consents obtained. 5. Plan Cervidil tonight. DIscussed induction process with patient and husband. Aware of risks of hyperstimulation, fetal intolerance, FTP, Cesarean section. Patient wishes to proceed with induction.  Farrel ConnersColleen Tauna Macfarlane  05/21/2016 8:57 PM

## 2016-05-22 ENCOUNTER — Encounter: Payer: Self-pay | Admitting: Anesthesiology

## 2016-05-22 ENCOUNTER — Inpatient Hospital Stay: Payer: BLUE CROSS/BLUE SHIELD | Admitting: Anesthesiology

## 2016-05-22 MED ORDER — AMMONIA AROMATIC IN INHA
RESPIRATORY_TRACT | Status: AC
  Filled 2016-05-22: qty 10

## 2016-05-22 MED ORDER — SODIUM CHLORIDE 0.9 % IJ SOLN
INTRAMUSCULAR | Status: AC
  Filled 2016-05-22: qty 50

## 2016-05-22 MED ORDER — LIDOCAINE HCL (PF) 1 % IJ SOLN
INTRAMUSCULAR | Status: DC | PRN
  Administered 2016-05-22: 1 mL via INTRADERMAL

## 2016-05-22 MED ORDER — FENTANYL 2.5 MCG/ML W/ROPIVACAINE 0.2% IN NS 100 ML EPIDURAL INFUSION (ARMC-ANES)
EPIDURAL | Status: DC | PRN
  Administered 2016-05-22: 10 mL/h via EPIDURAL

## 2016-05-22 MED ORDER — BUTORPHANOL TARTRATE 1 MG/ML IJ SOLN
1.0000 mg | INTRAMUSCULAR | Status: DC | PRN
  Administered 2016-05-22: 2 mg via INTRAVENOUS

## 2016-05-22 MED ORDER — BUTORPHANOL TARTRATE 1 MG/ML IJ SOLN
INTRAMUSCULAR | Status: AC
  Administered 2016-05-22: 2 mg via INTRAVENOUS
  Filled 2016-05-22: qty 2

## 2016-05-22 MED ORDER — OXYTOCIN 10 UNIT/ML IJ SOLN
INTRAMUSCULAR | Status: AC
  Filled 2016-05-22: qty 2

## 2016-05-22 MED ORDER — MISOPROSTOL 200 MCG PO TABS
ORAL_TABLET | ORAL | Status: AC
  Filled 2016-05-22: qty 4

## 2016-05-22 MED ORDER — SODIUM CHLORIDE 0.9 % IV SOLN
INTRAVENOUS | Status: DC | PRN
  Administered 2016-05-22 (×2): 5 mL via EPIDURAL

## 2016-05-22 MED ORDER — TERBUTALINE SULFATE 1 MG/ML IJ SOLN: 0.2500 mg | Freq: Once | INTRAMUSCULAR | Status: DC | PRN

## 2016-05-22 MED ORDER — FENTANYL 2.5 MCG/ML W/ROPIVACAINE 0.2% IN NS 100 ML EPIDURAL INFUSION (ARMC-ANES)
EPIDURAL | Status: AC
  Filled 2016-05-22: qty 100

## 2016-05-22 MED ORDER — LIDOCAINE HCL (PF) 1 % IJ SOLN
INTRAMUSCULAR | Status: AC
  Filled 2016-05-22: qty 30

## 2016-05-22 MED ORDER — LIDOCAINE-EPINEPHRINE (PF) 1.5 %-1:200000 IJ SOLN
INTRAMUSCULAR | Status: DC | PRN
  Administered 2016-05-22: 3 mL via EPIDURAL

## 2016-05-22 MED ORDER — OXYTOCIN 40 UNITS IN LACTATED RINGERS INFUSION - SIMPLE MED
1.0000 m[IU]/min | INTRAVENOUS | Status: DC
  Administered 2016-05-22: 2 m[IU]/min via INTRAVENOUS
  Filled 2016-05-22: qty 1000

## 2016-05-22 NOTE — Progress Notes (Signed)
Subjective:  Doing well increasing strength to contractions  Objective:   Vitals: Blood pressure 102/85, pulse (!) 107, temperature 98.6 F (37 C), temperature source Oral, resp. rate 18, height 5\' 1"  (1.549 m), weight 214 lb (97.1 kg), last menstrual period 07/19/2015. General: NAD Abdomen: gravid, non-tender Cervical Exam:  Dilation: 3.5 Effacement (%): 70 Cervical Position: Posterior Station: -1 Presentation: Vertex Exam by:: Delice LeschA. Samwise Eckardt, MD  FHT: 130, moderate, +accels, no decels Toco: q3-175min  Results for orders placed or performed during the hospital encounter of 05/21/16 (from the past 24 hour(s))  CBC     Status: Abnormal   Collection Time: 05/21/16  9:01 PM  Result Value Ref Range   WBC 11.6 (H) 3.6 - 11.0 K/uL   RBC 4.42 3.80 - 5.20 MIL/uL   Hemoglobin 13.6 12.0 - 16.0 g/dL   HCT 44.039.3 10.235.0 - 72.547.0 %   MCV 89.0 80.0 - 100.0 fL   MCH 30.8 26.0 - 34.0 pg   MCHC 34.6 32.0 - 36.0 g/dL   RDW 36.614.0 44.011.5 - 34.714.5 %   Platelets 189 150 - 440 K/uL  Type and screen  [facility]     Status: None   Collection Time: 05/21/16  9:01 PM  Result Value Ref Range   ABO/RH(D) AB POS    Antibody Screen NEG    Sample Expiration 05/24/2016     Assessment:   31 y.o. G1P0000 7435w0d IOL postdates  Plan:   1) Labor - continue to titrate pitocin  2) Fetus - cat I tracing

## 2016-05-22 NOTE — Anesthesia Procedure Notes (Signed)
Epidural Patient location during procedure: OB Start time: 05/22/2016 7:31 PM End time: 05/22/2016 7:34 PM  Staffing Anesthesiologist: Margorie JohnPISCITELLO, Glorianne Proctor K Performed: anesthesiologist   Preanesthetic Checklist Completed: patient identified, site marked, surgical consent, pre-op evaluation, timeout performed, IV checked, risks and benefits discussed and monitors and equipment checked  Epidural Patient position: sitting Prep: Betadine Patient monitoring: heart rate, continuous pulse ox and blood pressure Approach: midline Location: L4-L5 Injection technique: LOR saline  Needle:  Needle type: Tuohy  Needle gauge: 18 G Needle length: 9 cm and 9 Needle insertion depth: 7 cm Catheter type: closed end flexible Catheter size: 20 Guage Catheter at skin depth: 12 cm Test dose: negative and 1.5% lidocaine with Epi 1:200 K  Assessment Sensory level: T10 Events: blood not aspirated, injection not painful, no injection resistance, negative IV test and no paresthesia  Additional Notes Pt. Evaluated and documentation done after procedure finished. Patient identified. Risks/Benefits/Options discussed with patient including but not limited to bleeding, infection, nerve damage, paralysis, failed block, incomplete pain control, headache, blood pressure changes, nausea, vomiting, reactions to medication both or allergic, itching and postpartum back pain. Confirmed with bedside nurse the patient's most recent platelet count. Confirmed with patient that they are not currently taking any anticoagulation, have any bleeding history or any family history of bleeding disorders. Patient expressed understanding and wished to proceed. All questions were answered. Sterile technique was used throughout the entire procedure. Please see nursing notes for vital signs. Test dose was given through epidural catheter and negative prior to continuing to dose epidural or start infusion. Warning signs of high block given to  the patient including shortness of breath, tingling/numbness in hands, complete motor block, or any concerning symptoms with instructions to call for help. Patient was given instructions on fall risk and not to get out of bed. All questions and concerns addressed with instructions to call with any issues or inadequate analgesia.   Patient tolerated the insertion well without complications.  Reason for block:procedure for pain

## 2016-05-22 NOTE — Progress Notes (Signed)
Subjective:  Comfortable  Objective:   Vitals: Blood pressure 118/79, pulse (!) 110, temperature 98.6 F (37 C), temperature source Oral, resp. rate 18, height 5\' 1"  (1.549 m), weight 214 lb (97.1 kg), last menstrual period 07/19/2015. General: NAD Abdomen: gravid non-tender Cervical Exam:  Dilation: 1 Effacement (%): 70 Cervical Position: Posterior Station: -1 Presentation: Vertex Exam by:: Verda Cuminsollen, CNM  FHT: 140, moderate, +accels, no decels Toco:Irregular  Results for orders placed or performed during the hospital encounter of 05/21/16 (from the past 24 hour(s))  CBC     Status: Abnormal   Collection Time: 05/21/16  9:01 PM  Result Value Ref Range   WBC 11.6 (H) 3.6 - 11.0 K/uL   RBC 4.42 3.80 - 5.20 MIL/uL   Hemoglobin 13.6 12.0 - 16.0 g/dL   HCT 82.939.3 56.235.0 - 13.047.0 %   MCV 89.0 80.0 - 100.0 fL   MCH 30.8 26.0 - 34.0 pg   MCHC 34.6 32.0 - 36.0 g/dL   RDW 86.514.0 78.411.5 - 69.614.5 %   Platelets 189 150 - 440 K/uL  Type and screen [facility]     Status: None   Collection Time: 05/21/16  9:01 PM  Result Value Ref Range   ABO/RH(D) AB POS    Antibody Screen NEG    Sample Expiration 05/24/2016     Assessment:   31 y.o. G1P0000 5026w0d IOL postdates  Plan:   1) Labor - continue cervidil, remove at 12-hr mark allow to shower and eat.  Will start pitocin after and attempt foley placement once in contraction pattern  2) Fetus - cat I tracing

## 2016-05-22 NOTE — Anesthesia Preprocedure Evaluation (Addendum)
Anesthesia Evaluation  Patient identified by MRN, date of birth, ID band Patient awake    Reviewed: Allergy & Precautions, H&P , NPO status , Patient's Chart, lab work & pertinent test results  History of Anesthesia Complications Negative for: history of anesthetic complications  Airway Mallampati: III  TM Distance: >3 FB Neck ROM: full    Dental  (+) Poor Dentition   Pulmonary neg pulmonary ROS,    Pulmonary exam normal breath sounds clear to auscultation       Cardiovascular Exercise Tolerance: Good (-) hypertensionnegative cardio ROS Normal cardiovascular exam Rhythm:regular Rate:Normal     Neuro/Psych  Headaches,    GI/Hepatic negative GI ROS, neg GERD  ,  Endo/Other    Renal/GU   negative genitourinary   Musculoskeletal   Abdominal   Peds  Hematology negative hematology ROS (+)   Anesthesia Other Findings Past Medical History: No date: Abnormal Pap smear of cervix     Comment: 09-17-2011 ASCUS +HPV,12/2012 ASC-US No date: Migraines No date: Oligomenorrhea No date: STD (sexually transmitted disease)     Comment: HPV  Past Surgical History: 06-05-2012: COLPOSCOPY     Comment: ECC neg  BMI    Body Mass Index:  40.43 kg/m      Reproductive/Obstetrics (+) Pregnancy                             Anesthesia Physical Anesthesia Plan  ASA: III  Anesthesia Plan: Epidural   Post-op Pain Management:    Induction:   Airway Management Planned:   Additional Equipment:   Intra-op Plan:   Post-operative Plan:   Informed Consent: I have reviewed the patients History and Physical, chart, labs and discussed the procedure including the risks, benefits and alternatives for the proposed anesthesia with the patient or authorized representative who has indicated his/her understanding and acceptance.     Plan Discussed with: Anesthesiologist, CRNA and Surgeon  Anesthesia Plan  Comments:        Anesthesia Quick Evaluation

## 2016-05-22 NOTE — Anesthesia Procedure Notes (Signed)
Procedures

## 2016-05-22 NOTE — Progress Notes (Signed)
Subjective:    Objective:   Vitals: Blood pressure 122/80, pulse 98, temperature 98.6 F (37 C), temperature source Oral, resp. rate 18, height 5\' 1"  (1.549 m), weight 214 lb (97.1 kg), last menstrual period 07/19/2015. General: NAD Abdomen: gravid, non-tender Cervical Exam:  Dilation: 4 Effacement (%): 70 Cervical Position: Posterior Station: -1 Presentation: Vertex Exam by:: Delice LeschA. Hayven Fatima, MD  FHT: 130, moderate, +accels, no decels Toco: q2-193min  Results for orders placed or performed during the hospital encounter of 05/21/16 (from the past 24 hour(s))  CBC     Status: Abnormal   Collection Time: 05/21/16  9:01 PM  Result Value Ref Range   WBC 11.6 (H) 3.6 - 11.0 K/uL   RBC 4.42 3.80 - 5.20 MIL/uL   Hemoglobin 13.6 12.0 - 16.0 g/dL   HCT 95.239.3 84.135.0 - 32.447.0 %   MCV 89.0 80.0 - 100.0 fL   MCH 30.8 26.0 - 34.0 pg   MCHC 34.6 32.0 - 36.0 g/dL   RDW 40.114.0 02.711.5 - 25.314.5 %   Platelets 189 150 - 440 K/uL  Type and screen [facility]     Status: None   Collection Time: 05/21/16  9:01 PM  Result Value Ref Range   ABO/RH(D) AB POS    Antibody Screen NEG    Sample Expiration 05/24/2016     Assessment:   30 y.o. G1P0000 335w0d IOL postdates  Plan:   1) Labor - continue pitocin, AROM clear fluid IUPC placed  2) Fetus - cat I tracing

## 2016-05-23 ENCOUNTER — Encounter: Payer: Self-pay | Admitting: Anesthesiology

## 2016-05-23 ENCOUNTER — Encounter
Admission: EM | Disposition: A | Discharge status: Home / Self Care | Payer: Self-pay | Source: Home / Self Care | Attending: Obstetrics and Gynecology

## 2016-05-23 LAB — RPR: RPR: NONREACTIVE

## 2016-05-23 LAB — CBC
HEMATOCRIT: 28.5 % — AB (ref 35.0–47.0)
HEMOGLOBIN: 9.8 g/dL — AB (ref 12.0–16.0)
MCH: 31.1 pg (ref 26.0–34.0)
MCHC: 34.5 g/dL (ref 32.0–36.0)
MCV: 90.1 fL (ref 80.0–100.0)
Platelets: 173 10*3/uL (ref 150–440)
RBC: 3.16 MIL/uL — ABNORMAL LOW (ref 3.80–5.20)
RDW: 13.9 % (ref 11.5–14.5)
WBC: 19.8 10*3/uL — AB (ref 3.6–11.0)

## 2016-05-23 SURGERY — Surgical Case
Anesthesia: Epidural | Site: Abdomen | Wound class: Clean Contaminated

## 2016-05-23 MED ORDER — MIDAZOLAM HCL 2 MG/2ML IJ SOLN
INTRAMUSCULAR | Status: DC | PRN
  Administered 2016-05-23 (×2): 1 mg via INTRAVENOUS

## 2016-05-23 MED ORDER — ACETAMINOPHEN 325 MG PO TABS
650.0000 mg | ORAL_TABLET | ORAL | Status: DC | PRN
  Administered 2016-05-24: 650 mg via ORAL
  Filled 2016-05-23: qty 2

## 2016-05-23 MED ORDER — PROPOFOL 10 MG/ML IV BOLUS
INTRAVENOUS | Status: AC
Start: 2016-05-23 — End: 2016-05-23
  Filled 2016-05-23: qty 20

## 2016-05-23 MED ORDER — COCONUT OIL OIL
1.0000 "application " | TOPICAL_OIL | Status: DC | PRN
  Administered 2016-05-24: 1 via TOPICAL
  Filled 2016-05-23: qty 120

## 2016-05-23 MED ORDER — BUPIVACAINE HCL 0.25 % IJ SOLN
INTRAMUSCULAR | Status: DC | PRN
  Administered 2016-05-23: 10 mL

## 2016-05-23 MED ORDER — NALOXONE HCL 0.4 MG/ML IJ SOLN: 0.4000 mg | INTRAMUSCULAR | Status: DC | PRN

## 2016-05-23 MED ORDER — NALBUPHINE HCL 10 MG/ML IJ SOLN: 5.0000 mg | INTRAMUSCULAR | Status: DC | PRN

## 2016-05-23 MED ORDER — OXYCODONE HCL 5 MG PO TABS: 5.0000 mg | ORAL_TABLET | Freq: Once | ORAL | Status: DC | PRN

## 2016-05-23 MED ORDER — OXYCODONE-ACETAMINOPHEN 5-325 MG PO TABS
2.0000 | ORAL_TABLET | ORAL | Status: DC | PRN
  Administered 2016-05-24: 2 via ORAL
  Filled 2016-05-23: qty 2

## 2016-05-23 MED ORDER — CEFAZOLIN SODIUM-DEXTROSE 2-3 GM-% IV SOLR
INTRAVENOUS | Status: DC | PRN
  Administered 2016-05-23: 2 g via INTRAVENOUS

## 2016-05-23 MED ORDER — ACETAMINOPHEN 500 MG PO TABS
1000.0000 mg | ORAL_TABLET | Freq: Four times a day (QID) | ORAL | Status: AC
  Administered 2016-05-23 (×2): 1000 mg via ORAL
  Filled 2016-05-23 (×4): qty 2

## 2016-05-23 MED ORDER — EPHEDRINE SULFATE 50 MG/ML IJ SOLN
INTRAMUSCULAR | Status: AC
  Filled 2016-05-23: qty 1

## 2016-05-23 MED ORDER — LIDOCAINE HCL (PF) 2 % IJ SOLN
INTRAMUSCULAR | Status: AC
  Filled 2016-05-23: qty 4

## 2016-05-23 MED ORDER — FENTANYL CITRATE (PF) 100 MCG/2ML IJ SOLN
INTRAMUSCULAR | Status: AC
  Filled 2016-05-23: qty 2

## 2016-05-23 MED ORDER — MORPHINE SULFATE (PF) 0.5 MG/ML IJ SOLN
INTRAMUSCULAR | Status: DC | PRN
  Administered 2016-05-23: 4 mg via EPIDURAL

## 2016-05-23 MED ORDER — CEFAZOLIN SODIUM-DEXTROSE 2-4 GM/100ML-% IV SOLN
2.0000 g | INTRAVENOUS | Status: DC
  Filled 2016-05-23: qty 100

## 2016-05-23 MED ORDER — ONDANSETRON HCL 4 MG/2ML IJ SOLN: 4.0000 mg | Freq: Three times a day (TID) | INTRAMUSCULAR | Status: DC | PRN

## 2016-05-23 MED ORDER — MENTHOL 3 MG MT LOZG
1.0000 | LOZENGE | OROMUCOSAL | Status: DC | PRN
  Filled 2016-05-23: qty 9

## 2016-05-23 MED ORDER — SIMETHICONE 80 MG PO CHEW
80.0000 mg | CHEWABLE_TABLET | ORAL | Status: DC | PRN
Start: 2016-05-23 — End: 2016-05-26
  Administered 2016-05-24 – 2016-05-25 (×2): 80 mg via ORAL
  Filled 2016-05-23 (×2): qty 1

## 2016-05-23 MED ORDER — FENTANYL CITRATE (PF) 100 MCG/2ML IJ SOLN: 25.0000 ug | INTRAMUSCULAR | Status: DC | PRN

## 2016-05-23 MED ORDER — LIDOCAINE 2% (20 MG/ML) 5 ML SYRINGE
INTRAMUSCULAR | Status: DC | PRN
  Administered 2016-05-23 (×6): 100 mg via INTRAVENOUS

## 2016-05-23 MED ORDER — NALBUPHINE HCL 10 MG/ML IJ SOLN: 5.0000 mg | Freq: Once | INTRAMUSCULAR | Status: DC | PRN

## 2016-05-23 MED ORDER — SODIUM CHLORIDE 0.9% FLUSH: 3.0000 mL | INTRAVENOUS | Status: DC | PRN

## 2016-05-23 MED ORDER — OXYTOCIN 40 UNITS IN LACTATED RINGERS INFUSION - SIMPLE MED
2.5000 [IU]/h | INTRAVENOUS | Status: AC
  Filled 2016-05-23: qty 1000

## 2016-05-23 MED ORDER — FENTANYL CITRATE (PF) 100 MCG/2ML IJ SOLN
INTRAMUSCULAR | Status: DC | PRN
  Administered 2016-05-23 (×2): 50 ug via INTRAVENOUS

## 2016-05-23 MED ORDER — BUPIVACAINE 0.25 % ON-Q PUMP DUAL CATH 400 ML
400.0000 mL | INJECTION | Status: DC
  Filled 2016-05-23 (×2): qty 400

## 2016-05-23 MED ORDER — ONDANSETRON HCL 4 MG/2ML IJ SOLN
INTRAMUSCULAR | Status: DC | PRN
  Administered 2016-05-23: 4 mg via INTRAVENOUS

## 2016-05-23 MED ORDER — KETOROLAC TROMETHAMINE 30 MG/ML IJ SOLN: 30.0000 mg | Freq: Four times a day (QID) | INTRAMUSCULAR | Status: AC | PRN

## 2016-05-23 MED ORDER — IBUPROFEN 600 MG PO TABS
600.0000 mg | ORAL_TABLET | Freq: Four times a day (QID) | ORAL | Status: DC
  Administered 2016-05-24: 600 mg via ORAL
  Filled 2016-05-23: qty 1

## 2016-05-23 MED ORDER — OXYCODONE HCL 5 MG PO TABS: 5.0000 mg | ORAL_TABLET | Freq: Four times a day (QID) | ORAL | Status: AC | PRN

## 2016-05-23 MED ORDER — NALOXONE HCL 2 MG/2ML IJ SOSY
1.0000 ug/kg/h | PREFILLED_SYRINGE | INTRAVENOUS | Status: DC | PRN
  Filled 2016-05-23: qty 2

## 2016-05-23 MED ORDER — DIPHENHYDRAMINE HCL 25 MG PO CAPS: 25.0000 mg | ORAL_CAPSULE | Freq: Four times a day (QID) | ORAL | Status: DC | PRN

## 2016-05-23 MED ORDER — AZITHROMYCIN 500 MG IV SOLR
500.0000 mg | Freq: Once | INTRAVENOUS | Status: AC
  Administered 2016-05-23: 500 mg via INTRAVENOUS
  Filled 2016-05-23: qty 500

## 2016-05-23 MED ORDER — OXYCODONE-ACETAMINOPHEN 5-325 MG PO TABS: 1.0000 | ORAL_TABLET | ORAL | Status: DC | PRN

## 2016-05-23 MED ORDER — DIBUCAINE 1 % RE OINT
1.0000 | TOPICAL_OINTMENT | RECTAL | Status: DC | PRN
Start: 2016-05-23 — End: 2016-05-26

## 2016-05-23 MED ORDER — SIMETHICONE 80 MG PO CHEW
80.0000 mg | CHEWABLE_TABLET | ORAL | Status: DC
  Administered 2016-05-24 – 2016-05-25 (×2): 80 mg via ORAL
  Filled 2016-05-23 (×2): qty 1

## 2016-05-23 MED ORDER — MORPHINE SULFATE (PF) 0.5 MG/ML IJ SOLN
INTRAMUSCULAR | Status: AC
  Filled 2016-05-23: qty 10

## 2016-05-23 MED ORDER — SENNOSIDES-DOCUSATE SODIUM 8.6-50 MG PO TABS
2.0000 | ORAL_TABLET | ORAL | Status: DC
  Administered 2016-05-24 – 2016-05-26 (×3): 2 via ORAL
  Filled 2016-05-23 (×3): qty 2

## 2016-05-23 MED ORDER — LACTATED RINGERS IV SOLN
INTRAVENOUS | Status: DC
  Administered 2016-05-23 – 2016-05-24 (×2): via INTRAVENOUS

## 2016-05-23 MED ORDER — PRENATAL MULTIVITAMIN CH
1.0000 | ORAL_TABLET | Freq: Every day | ORAL | Status: DC
  Administered 2016-05-24 – 2016-05-26 (×3): 1 via ORAL
  Filled 2016-05-23 (×3): qty 1

## 2016-05-23 MED ORDER — SOD CITRATE-CITRIC ACID 500-334 MG/5ML PO SOLN
ORAL | Status: AC
  Administered 2016-05-23: 30 mL
  Filled 2016-05-23: qty 15

## 2016-05-23 MED ORDER — DIPHENHYDRAMINE HCL 25 MG PO CAPS: 25.0000 mg | ORAL_CAPSULE | ORAL | Status: DC | PRN

## 2016-05-23 MED ORDER — LACTATED RINGERS IV SOLN
INTRAVENOUS | Status: DC | PRN
  Administered 2016-05-23: 06:00:00 via INTRAVENOUS

## 2016-05-23 MED ORDER — KETOROLAC TROMETHAMINE 30 MG/ML IJ SOLN
30.0000 mg | Freq: Four times a day (QID) | INTRAMUSCULAR | Status: AC | PRN
  Administered 2016-05-23 – 2016-05-24 (×4): 30 mg via INTRAVENOUS
  Filled 2016-05-23 (×4): qty 1

## 2016-05-23 MED ORDER — OXYCODONE HCL 5 MG/5ML PO SOLN
5.0000 mg | Freq: Once | ORAL | Status: DC | PRN
  Filled 2016-05-23: qty 5

## 2016-05-23 MED ORDER — BUPIVACAINE HCL (PF) 0.25 % IJ SOLN
8.0000 mL | Freq: Once | INTRAMUSCULAR | Status: DC
  Filled 2016-05-23: qty 10

## 2016-05-23 MED ORDER — MIDAZOLAM HCL 2 MG/2ML IJ SOLN
INTRAMUSCULAR | Status: AC
  Filled 2016-05-23: qty 2

## 2016-05-23 MED ORDER — OXYTOCIN 40 UNITS IN LACTATED RINGERS INFUSION - SIMPLE MED
INTRAVENOUS | Status: DC | PRN
  Administered 2016-05-23: 299 mL via INTRAVENOUS
  Administered 2016-05-23: 1 mL via INTRAVENOUS

## 2016-05-23 MED ORDER — WITCH HAZEL-GLYCERIN EX PADS: 1.0000 "application " | MEDICATED_PAD | CUTANEOUS | Status: DC | PRN

## 2016-05-23 MED ORDER — DIPHENHYDRAMINE HCL 50 MG/ML IJ SOLN: 12.5000 mg | INTRAMUSCULAR | Status: DC | PRN

## 2016-05-23 MED ORDER — PHENYLEPHRINE HCL 10 MG/ML IJ SOLN
INTRAMUSCULAR | Status: DC | PRN
  Administered 2016-05-23 (×3): 100 ug via INTRAVENOUS

## 2016-05-23 MED ORDER — SIMETHICONE 80 MG PO CHEW
80.0000 mg | CHEWABLE_TABLET | Freq: Three times a day (TID) | ORAL | Status: DC
  Administered 2016-05-23 – 2016-05-26 (×8): 80 mg via ORAL
  Filled 2016-05-23 (×8): qty 1

## 2016-05-23 SURGICAL SUPPLY — 31 items
BAG COUNTER SPONGE EZ (MISCELLANEOUS) ×4 IMPLANT
CANISTER SUCT 3000ML (MISCELLANEOUS) ×3 IMPLANT
CATH KIT ON-Q SILVERSOAK 5IN (CATHETERS) ×6 IMPLANT
CHLORAPREP W/TINT 26ML (MISCELLANEOUS) ×6 IMPLANT
CLOSURE WOUND 1/2 X4 (GAUZE/BANDAGES/DRESSINGS)
COUNTER SPONGE BAG EZ (MISCELLANEOUS) ×2
DERMABOND ADVANCED (GAUZE/BANDAGES/DRESSINGS) ×2
DERMABOND ADVANCED .7 DNX12 (GAUZE/BANDAGES/DRESSINGS) ×1 IMPLANT
DRSG OPSITE POSTOP 4X10 (GAUZE/BANDAGES/DRESSINGS) IMPLANT
DRSG TELFA 3X8 NADH (GAUZE/BANDAGES/DRESSINGS) IMPLANT
ELECT CAUTERY BLADE 6.4 (BLADE) ×3 IMPLANT
ELECT REM PT RETURN 9FT ADLT (ELECTROSURGICAL) ×3
ELECTRODE REM PT RTRN 9FT ADLT (ELECTROSURGICAL) ×1 IMPLANT
GAUZE SPONGE 4X4 12PLY STRL (GAUZE/BANDAGES/DRESSINGS) IMPLANT
GLOVE BIO SURGEON STRL SZ7 (GLOVE) ×9 IMPLANT
GLOVE INDICATOR 7.5 STRL GRN (GLOVE) ×9 IMPLANT
GOWN STRL REUS W/ TWL LRG LVL3 (GOWN DISPOSABLE) ×3 IMPLANT
GOWN STRL REUS W/TWL LRG LVL3 (GOWN DISPOSABLE) ×6
LIQUID BAND (GAUZE/BANDAGES/DRESSINGS) IMPLANT
NS IRRIG 1000ML POUR BTL (IV SOLUTION) ×3 IMPLANT
PACK C SECTION AR (MISCELLANEOUS) ×3 IMPLANT
PAD OB MATERNITY 4.3X12.25 (PERSONAL CARE ITEMS) ×3 IMPLANT
PAD PREP 24X41 OB/GYN DISP (PERSONAL CARE ITEMS) ×3 IMPLANT
SPONGE LAP 18X18 5 PK (GAUZE/BANDAGES/DRESSINGS) ×6 IMPLANT
STAPLER INSORB 30 2030 C-SECTI (MISCELLANEOUS) ×3 IMPLANT
STRIP CLOSURE SKIN 1/2X4 (GAUZE/BANDAGES/DRESSINGS) IMPLANT
SUT MNCRL AB 4-0 PS2 18 (SUTURE) IMPLANT
SUT PDS AB 1 TP1 96 (SUTURE) ×3 IMPLANT
SUT VIC AB 0 CTX 36 (SUTURE) ×6
SUT VIC AB 0 CTX36XBRD ANBCTRL (SUTURE) ×3 IMPLANT
SUT VIC AB 2-0 CT1 36 (SUTURE) IMPLANT

## 2016-05-23 NOTE — Transfer of Care (Signed)
Immediate Anesthesia Transfer of Care Note  Patient: Amber Graves  Procedure(s) Performed: Procedure(s): CESAREAN SECTION (N/A)  Patient Location: PACU  Anesthesia Type:Epidural  Level of Consciousness: awake, alert , oriented and patient cooperative  Airway & Oxygen Therapy: Patient Spontanous Breathing  Post-op Assessment: Report given to RN and Post -op Vital signs reviewed and stable  Post vital signs: Reviewed and stable  Last Vitals:  Vitals:   05/23/16 0526 05/23/16 0531  BP: 107/83 116/78  Pulse: (!) 130 (!) 120  Resp:    Temp:      Last Pain:  Vitals:   05/23/16 0412  TempSrc: Axillary  PainSc:          Complications: No apparent anesthesia complications

## 2016-05-23 NOTE — Progress Notes (Signed)
Subjective:  Pushing with epidural, comfortable  Objective:   Vitals: Blood pressure 117/76, pulse (!) 104, temperature 100.1 F (37.8 C), temperature source Axillary, resp. rate 18, height 5\' 1"  (1.549 m), weight 214 lb (97.1 kg), last menstrual period 07/19/2015. General: NAD Abdomen: gravid, non-tender Cervical Exam:  Dilation: 10 Dilation Complete Date: 05/23/16 Dilation Complete Time: 0253 Effacement (%): 100 Cervical Position: Posterior Station: +2 Presentation: Vertex Exam by:: Bonney AidStaebler, MD No change in station or progress beneath pubic bone after 1.5hrs of pushing  FHT: 150, moderate, no accels, late deceleration with each contraction more subtle after brief break in pushing Toco: q625min  No results found for this or any previous visit (from the past 24 hour(s)).  Assessment:   31 y.o. G1P0000 8777w1d IOL postdate with recurrent lates and failure to progress  Plan:   1) The patient was counseled regarding risk and benefits to proceeding with Cesarean section to expedite delivery.  Risk of cesarean section were discussed including risk of bleeding and need for potential intraoperative or postoperative blood transfusion with a rate of approximately 5% quoted for all Cesarean sections, risk of injury to adjacent organs including but not limited to bowl and bladder, the need for additional surgical procedures to address such injuries, and the risk of infection.  The risk of continued attempts at vaginal delivery include but are note limited to worsening fetal or maternal status.  After consideration of options the patient is amenable to proceed with primary cesarean section for delivery.  2) Fetus - cat II tracing

## 2016-05-23 NOTE — Plan of Care (Signed)
Problem: Role Relationship: Goal: Ability to demonstrate positive interaction with the child will improve Outcome: Progressing Infant in SCN.

## 2016-05-23 NOTE — Op Note (Signed)
Preoperative Diagnosis: 1) 31 y.o. G1P0000 at 3722w1d postdates induction of labor 2) Fetal intolerance to labor (recurrent late decelerations) 3) Narrow pubic arch component of CPD  Postoperative Diagnosis: 1) 30 y.o. G1P1001 at 2422w1d 2) Fetal intolerance to labor (recurrent late decelerations) 3) Narrow pubic arch component of CPD  Operation Performed: Primary low transverse C-section via pfannenstiel skin incision  Indication: Patient had normal progression of labor, once she started pushing the fetus began displaying recurrent late decelerations.  These discontinued with cessation of pushing.  We discussed the fetal heart rate tracing, finding or narrow pubic arch, and likelihood of achieving vaginal delivery being low.  After discussion of option with her husband patient opted to attempt vaginal delivery for an additional 30 minutes.  Late deceleration returned but more subtle, there was no further descensus of the fetal head.  Patient was in agreement to proceed with primary cesarean section for delivery at this point  Anesthesia: .Epidural  Primary Surgeon: Vena AustriaAndreas Koden Hunzeker, MD  Assistant: Epimenio SarinShelby Street, Surgical Technologist  Preoperative Antibiotics: 2g ancef and 500mg  of azithromycin  Estimated Blood Loss: 800mL  IV Fluids: 700mL  Urine Output:: 50mL  Drains or Tubes: Foley to gravity drainage, ON-Q catheter system  Implants: none  Specimens Removed: none  Complications: none  Intraoperative Findings:  Normal tubes ovaries and uterus.  Delivery resulted in the birth of a liveborn female, apgars pending infant taken to NICU on CPAP  Patient Condition: stable  Procedure in Detail:  Patient was taken to the operating room were she was administered regional anesthesia.  She was positioned in the supine position, prepped and draped in the  Usual sterile fashion.  Prior to proceeding with the case a time out was performed and the level of anesthetic was checked and noted to  be adequate.  Utilizing the scalpel a pfannenstiel skin incision was made 2cm above the pubic symphysis and carried down sharply to the the level of the rectus fascia.  The fascia was incised in the midline using the scalpel and then extended using mayo scissors.  The superior border of the rectus fascia was grasped with two Kocher clamps and the underlying rectus muscles were dissected of the fascia using blunt dissection.  The median raphae was incised using Mayo scissors.   The inferior border of the rectus fascia was dissected of the rectus muscles in a similar fashion.  The midline was identified, the peritoneum was entered bluntly and expanded using manual tractions.  The uterus was noted to be in a none rotated position.  Next the bladder blade was placed retracting the bladder caudally.  A bladder flap was created.  The bladder reflection was grasped with a pickup, and Metzenbaum scissors were then used the undermine the bladder reflection.  The bladder flap was developed using digital dissection.  The bladder blade was replaced retracting the bladder caudally out of the operative field.  A low transverse incision was scored on the lower uterine segment.  The hysterotomy was entered bluntly using the operators finger.  The hysterotomy incision was extended using manual traction.  The operators hand was placed within the hysterotomy position noting the fetus to be within the OA position.  The vertex was grasped, flexed, brought to the incision, and delivered a traumatically using fundal pressure.  The remainder of the body delivered with ease.  The infant was suctioned, cord was clamped and cut before handing off to the awaiting neonatologist.  The placenta was delivered using manual extraction.  The uterus  was exteriorized, wiped clean of clots and debris using two moist laps.  The hysterotomy was closed using a two layer closure of 0 Vicryl, with the first being a running locked, the second a vertical  imbricating.  The uterus was returned to the abdomen.  The peritoneal gutters were wiped clean of clots and debris using two moist laps.  The hysterotomy incision was re-inspected noted to be hemostatic.  The rectus muscles were re-approximated in the midline using a single 2-0 Vicryl mattress stitch.  The rectus muscles were inspected noted to be hemostatic.  The superior border of the rectus fascia was grasped with a Kocher clamp.  The ON-Q trocars were then placed 4cm above the superior border of the incision and tunneled subfascially.  The introducers were removed and the catheters were threaded through the sleeves after which the sleeves were removed.  The fascia was closed using a looped #1 PDS in a running fashion taking 1cm by 1cm bites.  The subcutaneous tissue was irrigated using warm saline, hemostasis achieved using the bovie.  The subcutaneous dead space was less than 3cm and was not closed.  The skin was closed using Insorb staples.  Sponge needle and instrument counts were corrects times two.  The patient tolerated the procedure well and was taken to the recovery room in stable condition.

## 2016-05-23 NOTE — Anesthesia Post-op Follow-up Note (Cosign Needed)
Anesthesia QCDR form completed.        

## 2016-05-23 NOTE — Discharge Summary (Signed)
OB Discharge Summary     Patient Name: Rashada Klontz DOB: 1985-10-07 MRN: [id number]  Date of admission: 05/21/2016 Delivering MD: Vena Austria   Date of discharge: 05/26/2016  Admitting diagnosis: 41 weeks preg induction failure to deliver Intrauterine pregnancy: [redacted]w[redacted]d     Secondary diagnosis:  Active Problems:   Indication for care in labor or delivery   Postpartum care following cesarean delivery  Additional problems: Fetal intolerance to labor, CPD     Discharge diagnosis: Term Pregnancy Delivered                                                                                                Post partum procedures:none  Augmentation: AROM, Pitocin and cervidil  Complications: fetal intolerance to labor  Hospital course:  Induction of Labor With Cesarean Section  31 y.o. yo G1P0000 at [redacted]w[redacted]d was admitted to the hospital 05/21/2016 for induction of labor. Patient had a labor course significant for fetal intolerance to labor, also a component of CPD. The patient went for cesarean section due to Non-Reassuring FHR, and delivered a Viable infant,@BABYSUPPRESS (DBLINK,ept,110,,1,,) Membrane Rupture Time/Date: )6:13 PM ,05/22/2016   @Details  of operation can be found in separate operative Note.  Patient had an uncomplicated postpartum course. She is ambulating, tolerating a regular diet, passing flatus, and urinating well.  Patient is discharged home in stable condition on 05/26/16.                                    Physical exam  Vitals:   05/25/16 2048 05/25/16 2125 05/26/16 0029 05/26/16 0404  BP: 118/79     Pulse: (!) 102 88    Resp: 18     Temp: 98.2 F (36.8 C)  98.3 F (36.8 C) 98.3 F (36.8 C)  TempSrc: Oral  Oral Oral  SpO2:      Weight:      Height:       General: alert, cooperative and no distress Lochia: appropriate Uterine Fundus: firm Incision: Healing well with no significant drainage DVT Evaluation: No evidence of DVT seen on physical  exam. Labs: Lab Results  Component Value Date   WBC 16.0 (H) 05/24/2016   HGB 10.2 (L) 05/24/2016   HCT 29.7 (L) 05/24/2016   MCV 92.7 05/24/2016   PLT 155 05/24/2016   CMP Latest Ref Rng & Units 07/04/2015  Glucose 65 - 99 mg/dL 95  BUN 6 - 20 mg/dL 7  Creatinine 6.21 - 3.08 mg/dL 6.57  Sodium 846 - 962 mmol/L 140  Potassium 3.5 - 5.1 mmol/L 3.4(L)  Chloride 101 - 111 mmol/L 103  CO2 22 - 32 mmol/L 29  Calcium 8.9 - 10.3 mg/dL 8.9  Total Protein 6.5 - 8.1 g/dL 7.0  Total Bilirubin 0.3 - 1.2 mg/dL 1.0  Alkaline Phos 38 - 126 U/L 44  AST 15 - 41 U/L 30  ALT 14 - 54 U/L 31    Discharge instruction: per After Visit Summary and "Baby and Me Booklet".  After visit meds:  Allergies as of  05/26/2016   No Known Allergies     Medication List    TAKE these medications   norethindrone 0.35 MG tablet Commonly known as:  MICRONOR,CAMILA,ERRIN Take 1 tablet (0.35 mg total) by mouth daily. Start taking on:  06/09/2016   oxyCODONE-acetaminophen 5-325 MG tablet Commonly known as:  PERCOCET/ROXICET Take 1 tablet by mouth every 4 (four) hours as needed for severe pain (pain scale 4-7).   prenatal multivitamin Tabs tablet Take 1 tablet by mouth daily at 12 noon.       Diet: routine diet  Activity: Advance as tolerated. Pelvic rest for 6 weeks.   Outpatient follow up:1 week Follow up Appt:with Dr Bonney AidStaebler for incision check   Postpartum contraception: Progesterone only pills  Newborn Data: Live born female  Birth Weight: 7 lb 12.2 oz (3520 g) APGAR: 1, 6, 8   Baby Feeding: Breast Disposition:home with mother   05/26/2016 Tresea MallJane Adama Ferber, CNM

## 2016-05-24 LAB — CBC
HCT: 29.7 % — ABNORMAL LOW (ref 35.0–47.0)
HEMOGLOBIN: 10.2 g/dL — AB (ref 12.0–16.0)
MCH: 31.9 pg (ref 26.0–34.0)
MCHC: 34.4 g/dL (ref 32.0–36.0)
MCV: 92.7 fL (ref 80.0–100.0)
PLATELETS: 155 10*3/uL (ref 150–440)
RBC: 3.2 MIL/uL — ABNORMAL LOW (ref 3.80–5.20)
RDW: 14.2 % (ref 11.5–14.5)
WBC: 16 10*3/uL — ABNORMAL HIGH (ref 3.6–11.0)

## 2016-05-24 MED ORDER — SODIUM CHLORIDE 0.9 % IV BOLUS (SEPSIS)
500.0000 mL | Freq: Once | INTRAVENOUS | Status: AC
  Administered 2016-05-24: 500 mL via INTRAVENOUS

## 2016-05-24 MED ORDER — IBUPROFEN 600 MG PO TABS
600.0000 mg | ORAL_TABLET | Freq: Four times a day (QID) | ORAL | Status: DC
  Administered 2016-05-24 – 2016-05-26 (×8): 600 mg via ORAL
  Filled 2016-05-24 (×9): qty 1

## 2016-05-24 NOTE — Anesthesia Postprocedure Evaluation (Signed)
Anesthesia Post Note  Patient: Amber Graves  Procedure(s) Performed: Procedure(s) (LRB): CESAREAN SECTION (N/A)  Patient location during evaluation: Mother Baby Anesthesia Type: Epidural Level of consciousness: awake, awake and alert and oriented Pain management: pain level controlled Vital Signs Assessment: post-procedure vital signs reviewed and stable Respiratory status: spontaneous breathing Cardiovascular status: blood pressure returned to baseline Postop Assessment: no headache, no backache, no signs of nausea or vomiting and adequate PO intake Anesthetic complications: no     Last Vitals:  Vitals:   05/23/16 2359 05/24/16 0422  BP: (!) 93/55 (!) 97/56  Pulse: (!) 110 91  Resp: 20 20  Temp:  36.8 C    Last Pain:  Vitals:   05/24/16 0622  TempSrc:   PainSc: 3                  Faustina Gebert Lawerance CruelStarr

## 2016-05-24 NOTE — Anesthesia Post-op Follow-up Note (Signed)
  Anesthesia Pain Follow-up Note  Patient: Amber Graves  Day #: 1  Date of Follow-up: 05/24/2016 Time: 7:27 AM  Last Vitals:  Vitals:   05/23/16 2359 05/24/16 0422  BP: (!) 93/55 (!) 97/56  Pulse: (!) 110 91  Resp: 20 20  Temp:  36.8 C    Level of Consciousness: alert  Pain: none   Side Effects:None  Catheter Site Exam:clean, dry, no drainage     Plan: D/C from anesthesia care at surgeon's request  Amber Graves

## 2016-05-24 NOTE — Progress Notes (Addendum)
Subjective:  Doing well.  Pain adequately controlled on po analgesics.  Tolerating po.  No fevers, no chills.  Minimal lochia  Objective:  Blood pressure (!) 106/59, pulse 100, temperature 98.6 F (37 C), temperature source Oral, resp. rate 18, height 5\' 1"  (1.549 m), weight 214 lb (97.1 kg), last menstrual period 07/19/2015, SpO2 100 %.     Intake/Output Summary (Last 24 hours) at 05/24/16 0923 Last data filed at 05/24/16 0557  Gross per 24 hour  Intake                0 ml  Output             1650 ml  Net            -1650 ml    General: NAD Pulmonary: no increased work of breathing Abdomen: non-distended, non-tender, fundus firm at level of umbilicus Incision: D/C/I Extremities: no edema, no erythema, no tenderness  Results for orders placed or performed during the hospital encounter of 05/21/16 (from the past 72 hour(s))  CBC     Status: Abnormal   Collection Time: 05/21/16  9:01 PM  Result Value Ref Range   WBC 11.6 (H) 3.6 - 11.0 K/uL   RBC 4.42 3.80 - 5.20 MIL/uL   Hemoglobin 13.6 12.0 - 16.0 g/dL   HCT 16.1 09.6 - 04.5 %   MCV 89.0 80.0 - 100.0 fL   MCH 30.8 26.0 - 34.0 pg   MCHC 34.6 32.0 - 36.0 g/dL   RDW 40.9 81.1 - 91.4 %   Platelets 189 150 - 440 K/uL  Type and screen [facility]     Status: None   Collection Time: 05/21/16  9:01 PM  Result Value Ref Range   ABO/RH(D) AB POS    Antibody Screen NEG    Sample Expiration 05/24/2016   RPR     Status: None   Collection Time: 05/21/16  9:01 PM  Result Value Ref Range   RPR Ser Ql Non Reactive Non Reactive    Comment: (NOTE) Performed At: [facility] [address] [place], [place] [id number] Mila Homer MD [id number]   CBC     Status: Abnormal   Collection Time: 05/23/16 11:19 PM  Result Value Ref Range   WBC 19.8 (H) 3.6 - 11.0 K/uL   RBC 3.16 (L) 3.80 - 5.20 MIL/uL   Hemoglobin 9.8 (L) 12.0 - 16.0 g/dL   HCT 52.8 (L) 41.3 - 24.4 %   MCV 90.1 80.0 - 100.0 fL   MCH  31.1 26.0 - 34.0 pg   MCHC 34.5 32.0 - 36.0 g/dL   RDW 01.0 27.2 - 53.6 %   Platelets 173 150 - 440 K/uL  CBC     Status: Abnormal   Collection Time: 05/24/16  5:08 AM  Result Value Ref Range   WBC 16.0 (H) 3.6 - 11.0 K/uL   RBC 3.20 (L) 3.80 - 5.20 MIL/uL   Hemoglobin 10.2 (L) 12.0 - 16.0 g/dL   HCT 64.4 (L) 03.4 - 74.2 %   MCV 92.7 80.0 - 100.0 fL   MCH 31.9 26.0 - 34.0 pg   MCHC 34.4 32.0 - 36.0 g/dL   RDW 59.5 63.8 - 75.6 %   Platelets 155 150 - 440 K/uL     Assessment:   31 y.o. G1P0000 postoperativeday # 1 LTCS fetal intolerance and 2nd stage arrest   Plan:  1) Acute blood loss anemia - hemodynamically stable and asymptomatic - po  ferrous sulfate  2) Blood Type --/--/AB POS (03/06 2101) / Rubella Immune (07/07 0000) / Varicella Immune  3) TDAP status UTD  4) Breast/Mini pill  5) Postop - saline lock once voided  6) Disposition anticipate discharge POD3-4

## 2016-05-25 NOTE — Progress Notes (Signed)
  Subjective:   Doing well Post Op day 2, baby is discharged from special care nursery and pt is excited that baby will be rooming in. Tolerating PO intake and pain is well controlled with PO meds and On Q pump. Ambulating and voiding without difficulty. Pumping currently to stimulate milk production.  Objective:  Blood pressure 115/68, pulse 98, temperature 98.8 F (37.1 C), temperature source Oral, resp. rate 18, height 5\' 1"  (1.549 m), weight 214 lb (97.1 kg), last menstrual period 07/19/2015, SpO2 100 %.  General: NAD Pulmonary: no increased work of breathing Abdomen: non-distended, non-tender, fundus firm at level of umbilicus Incision: C/D/I, no s/s of infection, On q pump intact Extremities: no edema, no erythema, no tenderness    Assessment:   31 y.o. G1P0000 postoperativeday # 2   Plan:  1) Acute blood loss anemia - hemodynamically stable and asymptomatic - po ferrous sulfate  2) AB positive, Rubella Immune, Varicella Immune  3) TDAP status: UTD  4) Breast/Contraception: Progesterone only pill  5) Disposition: Discharge to home possible tomorrow   Tresea MallJane Alette Kataoka, CNM

## 2016-05-26 MED ORDER — OXYCODONE-ACETAMINOPHEN 5-325 MG PO TABS: 1.0000 | ORAL_TABLET | ORAL | 0 refills | Status: DC | PRN

## 2016-05-26 MED ORDER — NORETHINDRONE 0.35 MG PO TABS: 1.0000 | ORAL_TABLET | Freq: Every day | ORAL | 11 refills | Status: DC

## 2016-05-26 NOTE — Discharge Summary (Signed)
Pt. Received discharge teaching and follow-up appt. Pt.'s ID band was matched with baby's ID band. Pt. Carried baby in wheelchair to car.

## 2016-05-29 ENCOUNTER — Encounter: Payer: BLUE CROSS/BLUE SHIELD | Admitting: Obstetrics and Gynecology

## 2016-05-30 ENCOUNTER — Encounter: Payer: BLUE CROSS/BLUE SHIELD | Admitting: Obstetrics and Gynecology

## 2016-07-03 ENCOUNTER — Encounter: Payer: BLUE CROSS/BLUE SHIELD | Admitting: Obstetrics and Gynecology

## 2016-07-03 ENCOUNTER — Encounter: Payer: Self-pay | Admitting: Obstetrics and Gynecology

## 2016-07-10 ENCOUNTER — Telehealth: Payer: Self-pay

## 2016-07-10 ENCOUNTER — Encounter: Payer: Self-pay | Admitting: Obstetrics and Gynecology

## 2016-07-10 NOTE — Telephone Encounter (Signed)
On your desk

## 2016-07-10 NOTE — Telephone Encounter (Signed)
Pt is able to return to work. 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 [phone].

## 2016-07-10 NOTE — Telephone Encounter (Signed)
Please advise 

## 2016-07-11 NOTE — Telephone Encounter (Signed)
Pt aware note for work is ready to be picked up. I asked her if she wanted me to fax it and she stated she will try and come by today to get it. Note put at front desk.

## 2016-08-01 ENCOUNTER — Telehealth: Payer: Self-pay

## 2016-08-01 NOTE — Telephone Encounter (Signed)
Left msg for pt to call c [facility] address as there are several [place] the [place].

## 2016-08-01 NOTE — Telephone Encounter (Signed)
Pt calling.  Her ins wants her to use [facility] for a 90d supply.  Request was sent but no response.  Therefore, rx is now dropped.  Pt is almost out of pills. [phone]

## 2016-08-05 ENCOUNTER — Other Ambulatory Visit: Payer: Self-pay

## 2016-08-05 DIAGNOSIS — Z308 Encounter for other contraceptive management: Secondary | ICD-10-CM

## 2016-08-05 MED ORDER — NORETHINDRONE 0.35 MG PO TABS: 1.0000 | ORAL_TABLET | Freq: Every day | ORAL | 3 refills | Status: DC

## 2016-08-05 NOTE — Telephone Encounter (Signed)
Pt returned call.  [facility] is the pharm.  Camilla order eRx'd c 90d supply

## 2016-09-09 ENCOUNTER — Telehealth: Payer: Self-pay

## 2016-09-09 NOTE — Telephone Encounter (Signed)
Pt is weaning off breastfeeding this week and needs diff bcp called in.  Is currently on Heather.  [phone]

## 2016-09-09 NOTE — Telephone Encounter (Signed)
Please advise 

## 2016-09-10 ENCOUNTER — Other Ambulatory Visit: Payer: Self-pay | Admitting: Obstetrics and Gynecology

## 2016-09-10 ENCOUNTER — Other Ambulatory Visit: Payer: Self-pay

## 2016-09-10 MED ORDER — NORGESTIMATE-ETH ESTRADIOL 0.25-35 MG-MCG PO TABS: 1.0000 | ORAL_TABLET | Freq: Every day | ORAL | 3 refills | Status: DC

## 2016-09-10 MED ORDER — NORGESTIMATE-ETH ESTRADIOL 0.25-35 MG-MCG PO TABS: 1.0000 | ORAL_TABLET | Freq: Every day | ORAL | 11 refills | Status: DC

## 2016-09-10 NOTE — Telephone Encounter (Signed)
Pt aware of medicaton being called in. She states she needed 90 day supply sent into [facility]. I resent medication to right pharmacy

## 2016-09-10 NOTE — Telephone Encounter (Signed)
Rx has been called in  

## 2017-07-14 ENCOUNTER — Ambulatory Visit: Payer: BLUE CROSS/BLUE SHIELD | Admitting: Obstetrics and Gynecology

## 2017-07-23 ENCOUNTER — Other Ambulatory Visit: Payer: Self-pay | Admitting: Obstetrics and Gynecology

## 2017-07-30 ENCOUNTER — Ambulatory Visit (INDEPENDENT_AMBULATORY_CARE_PROVIDER_SITE_OTHER): Payer: BLUE CROSS/BLUE SHIELD | Admitting: Obstetrics and Gynecology

## 2017-07-30 ENCOUNTER — Encounter: Payer: Self-pay | Admitting: Obstetrics and Gynecology

## 2017-07-30 ENCOUNTER — Encounter

## 2017-07-30 ENCOUNTER — Encounter (INDEPENDENT_AMBULATORY_CARE_PROVIDER_SITE_OTHER): Payer: Self-pay

## 2017-07-30 VITALS — BP 104/60 | HR 86 | Ht 61.0 in | Wt 192.0 lb

## 2017-07-30 DIAGNOSIS — N970 Female infertility associated with anovulation: Secondary | ICD-10-CM | POA: Diagnosis not present

## 2017-07-30 DIAGNOSIS — Z01419 Encounter for gynecological examination (general) (routine) without abnormal findings: Secondary | ICD-10-CM | POA: Diagnosis not present

## 2017-07-30 DIAGNOSIS — E669 Obesity, unspecified: Secondary | ICD-10-CM

## 2017-07-30 DIAGNOSIS — N939 Abnormal uterine and vaginal bleeding, unspecified: Secondary | ICD-10-CM | POA: Diagnosis not present

## 2017-07-30 DIAGNOSIS — Z6837 Body mass index (BMI) 37.0-37.9, adult: Secondary | ICD-10-CM

## 2017-07-30 MED ORDER — PHENTERMINE HCL 37.5 MG PO TABS: 37.5000 mg | ORAL_TABLET | Freq: Every day | ORAL | 0 refills | Status: DC

## 2017-07-30 NOTE — Progress Notes (Signed)
Gynecology Annual Exam   PCP: Patient, No Pcp Per  Chief Complaint:  Chief Complaint  Patient presents with  . Gynecologic Exam    Requesting bloodwork    History of Present Illness: Patient is a 32 y.o. G1P1001 presents for annual exam. The patient has no complaints today.   LMP: Patient's last menstrual period was 07/23/2017. She has only had one menses in the last 3 months since discontinuing OCP's  The patient is sexually active. She currently uses none for contraception. She denies dyspareunia.    There is no notable family history of breast or ovarian cancer in her family.  The patient wears seatbelts: yes.   The patient has regular exercise: yes.  Currently enrolled in boot camp.  The patient denies current symptoms of depression.     Patientis a 32 y.o. G59P1001 female, who presents for the evaluation of weight gain. 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  The patient states the following issues have contributed to her weight problem: none identifiable but she is worried about thyroid The patient has no additional symptoms. The patient specifically denies memory loss, muscle weakness, excessive thirst, and polyuria. Weight related co-morbidities include none. The patient's past medical history is notable for none. She has not tried medical interventions in the past.  Review of Systems: Review of Systems  Constitutional: Negative for chills and fever.  HENT: Negative for congestion.   Respiratory: Negative for cough and shortness of breath.   Cardiovascular: Negative for chest pain and palpitations.  Gastrointestinal: Negative for abdominal pain, constipation, diarrhea, heartburn, nausea and vomiting.  Genitourinary: Negative for dysuria, frequency and urgency.  Skin: Negative for itching and rash.  Neurological: Negative for dizziness and headaches.  Endo/Heme/Allergies:  Negative for polydipsia.  Psychiatric/Behavioral: Negative for depression.    Past Medical History:  Past Medical History:  Diagnosis Date  . Abnormal Pap smear of cervix    09-17-2011 ASCUS +HPV,12/2012 ASC-US  . Migraines   . Oligomenorrhea   . STD (sexually transmitted disease)    HPV    Past Surgical History:  Past Surgical History:  Procedure Laterality Date  . CESAREAN SECTION N/A 05/23/2016   Procedure: CESAREAN SECTION;  Surgeon: Vena Austria, MD;  Location: [facility] ORS;  Service: Obstetrics;  Laterality: N/A;  . COLPOSCOPY  06-05-2012   ECC neg    Gynecologic History:  Patient's last menstrual period was 07/23/2017. Contraception: none Last Pap: Results were:06/23/16 NIL   Obstetric History: G1P1001  Family History:  Family History  Problem Relation Age of Onset  . Tongue cancer Mother   . Thyroid disease Mother   . Diabetes Paternal Uncle   . Diabetes Paternal Grandmother   . Stroke Paternal Grandmother   . Diabetes Paternal Grandfather   . Thyroid disease Maternal Grandmother   . Heart disease Maternal Grandfather   . Diabetes Paternal Aunt     Social History:  Social History   Socioeconomic History  . Marital status: Married    Spouse name: Not on file  . Number of children: Not on file  . Years of education: Not on file  . Highest education level: Not on file  Occupational History  . Not on file  Social Needs  . Financial resource strain: Not on file  . Food insecurity:    Worry: Not on file    Inability: Not on file  . Transportation needs:  Medical: Not on file    Non-medical: Not on file  Tobacco Use  . Smoking status: Never Smoker  . Smokeless tobacco: Never Used  Substance and Sexual Activity  . Alcohol use: Yes    Alcohol/week: 0.0 - 0.5 oz  . Drug use: No  . Sexual activity: Yes    Partners: Male    Birth control/protection: None  Lifestyle  . Physical activity:    Days per week: Not on file    Minutes per session: Not on  file  . Stress: Not on file  Relationships  . Social connections:    Talks on phone: Not on file    Gets together: Not on file    Attends religious service: Not on file    Active member of club or organization: Not on file    Attends meetings of clubs or organizations: Not on file    Relationship status: Not on file  . Intimate partner violence:    Fear of current or ex partner: Not on file    Emotionally abused: Not on file    Physically abused: Not on file    Forced sexual activity: Not on file  Other Topics Concern  . Not on file  Social History Narrative  . Not on file    Allergies:  No Known Allergies  Medications: Prior to Admission medications   Medication Sig Start Date End Date Taking? Authorizing Provider  phentermine (ADIPEX-P) 37.5 MG tablet Take 1 tablet (37.5 mg total) by mouth daily before breakfast. 07/30/17   Vena Austria, MD    Physical Exam Vitals: Blood pressure 104/60, pulse 86, height  (1.549 m), weight 192 lb (87.1 kg), last menstrual period 07/23/2017, not currently breastfeeding.  General: NAD HEENT: normocephalic, anicteric Thyroid: no enlargement, no palpable nodules Pulmonary: No increased work of breathing, CTAB Cardiovascular: RRR, distal pulses 2+ Abdomen:Soft, non-tender, non-distended.  Umbilicus without lesions.  No hepatomegaly, splenomegaly or masses palpable. No evidence of hernia  Genitourinary:  External: Normal external female genitalia.  Normal urethral meatus, normal Bartholin's and Skene's glands.    Vagina: Normal vaginal mucosa, no evidence of prolapse.    Cervix: Grossly normal in appearance, no bleeding  Uterus: Non-enlarged, mobile, normal contour.  No CMT  Adnexa: ovaries non-enlarged, no adnexal masses  Rectal: deferred  Lymphatic: no evidence of inguinal lymphadenopathy Extremities: no edema, erythema, or tenderness Neurologic: Grossly intact Psychiatric: mood appropriate, affect full  Female chaperone  present for pelvic and breast  portions of the physical exam    Assessment: 32 y.o. G1P1001 routine annual exam  Plan: Problem List Items Addressed This Visit    None    Visit Diagnoses    Anovulatory cycle    -  Primary   Relevant Orders   TSH+Prl+FSH+TestT+LH+DHEA S...   Abnormal uterine bleeding       Relevant Orders   TSH+Prl+FSH+TestT+LH+DHEA S...   Class 2 obesity without serious comorbidity with body mass index (BMI) of 37.0 to 37.9 in adult, unspecified obesity type       Relevant Medications   phentermine (ADIPEX-P) 37.5 MG tablet   Other Relevant Orders   TSH+Prl+FSH+TestT+LH+DHEA S...     Annual  1) STI screening  was notoffered and therefore not obtained  2)  ASCCP guidelines and rational discussed.  Patient opts for every 3 years screening interval  3) Contraception - the patient is currently using  none.  She is attempting to conceive in the near future - given irregularities in cycle will  check PCOS panel.  - We discussed the underlying etiologies which may be implicated in a couple experiencing difficulty conceiving.  The average couple will conceive within the span of 1 year with unprotected coitus, with a monthly fecundity rate of 20% or 1 in 5.  Even without further work up or intervention the patient and her partner may be successful in conceiving unassisted, although if an underlying etiology can be identified and addressed fecundity rate may improve.  The work up entails examining for ovulatory function, tubal patency (may assume patent given recent pregnancy), and ruling out female factor infertility (can assume normal given recent pregnancy).  These may be looked at concurrently or sequentially.  The downside of sequential work up is that this method may miss issues if more than one compartment is contributing.  She is aware that tubal factor or moderate to severe female factor infertility will require further consultation with a reproductive endocrinologist.  In  the case of anovulation, use of Clomid (clomiphen citrate) or Femara (letrazole) were discussed with the understanding the the later is an off-label, but well supported use.  Current recommendation are to use letrozole first line secondary to increased live birth rate compared to clomid for patients with PCOS ("Polycystic Ovarian Syndrome" ACOG Practice Bulletin 194 June 2018).  With either of these drugs the risk of multiples increases from the standard population rate of 2% to approximately 10%, with higher order multiples possible but unlikely.  Both drugs may require some time to titrate to the appropriate dosage to ensure consistent ovulation.  Cycles will be limited to 6 cycles on each drug secondary to decreasing rates of conception after 6 cycles.  In addition should patient be started on ovulation induction with Clomid she was advised to discontinue the drug for any vision changes as this is a rare but potentially permanent side-effect if medication is continued.  Was counseled that letrozole may cause idiopathic bone pain that generally resolves.  Hot flashes, headaches, and nausea were mentioned as the most commonly encountered side-effects of both drugs.  We discussed timing of intercourse as well as the use of ovulation predictor kits identify the patient's fertile window each month.     4) Routine healthcare maintenance including cholesterol, diabetes screening discussed managed by PCP  5) Return in about 1 month (around 08/27/2017) for medication follow up.  Weight loss  1) 1500 Calorie ADA Diet  2) Patient education given regarding appropriate lifestyle changes for weight loss including: regular physical activity, healthy coping strategies, caloric restriction and healthy eating patterns.  3) Patient will be started on weight loss medication. The risks and benefits and side effects of medication, such as Adipex (Phenteramine) ,  Tenuate (Diethylproprion), Belviq (lorcarsin), Contrave  (buproprion/naltrexone), Qsymia (phentermine/topiramate), and Saxenda (liraglutide) is discussed. The pros and cons of suppressing appetite and boosting metabolism is discussed. Risks of tolerence and addiction is discussed for selected agents discussed. Use of medicine will ne short term, such as 3-4 months at a time followed by a period of time off of the medicine to avoid these risks and side effects for Adipex, Qsymia, and Tenuate discussed. Pt to call with any negative side effects and agrees to keep follow up appts.  4) Comorbidity Screening - hypothyroidism screening, diabetes, and hyperlipidemia screening offered - will check TSH with fertility labs  5) Encouraged weekly weight monitorig to track progress and sample 1 week food diary  6) Contraception - discussed that all weight loss drugs fall in to pregnancy category  X, patient currently is trying to conceive.  Should she conceive we discussed stopping phentermine with positive UPT.    7) 15 minutes face-to-face; counseling/coordination of care > 50 percent of visit  8) Follow up in 4 weeks to assess response   Vena Austria, MD, [facility], [facility]

## 2017-08-02 LAB — TSH+PRL+FSH+TESTT+LH+DHEA S...
17 HYDROXYPROGESTERONE: 28 ng/dL
ANDROSTENEDIONE: 83 ng/dL (ref 41–262)
DHEA-SO4: 93.5 ug/dL (ref 84.8–378.0)
FSH: 10.2 m[IU]/mL
LH: 12.7 m[IU]/mL
Prolactin: 7.7 ng/mL (ref 4.8–23.3)
TESTOSTERONE FREE: 0.8 pg/mL (ref 0.0–4.2)
TESTOSTERONE: 17 ng/dL (ref 8–48)
TSH: 1.54 u[IU]/mL (ref 0.450–4.500)

## 2017-08-07 ENCOUNTER — Encounter (INDEPENDENT_AMBULATORY_CARE_PROVIDER_SITE_OTHER): Payer: Self-pay

## 2017-08-27 ENCOUNTER — Encounter: Payer: Self-pay | Admitting: Obstetrics and Gynecology

## 2017-08-27 ENCOUNTER — Ambulatory Visit: Payer: BLUE CROSS/BLUE SHIELD | Admitting: Obstetrics and Gynecology

## 2017-08-27 VITALS — BP 118/74 | HR 81 | Wt 189.0 lb

## 2017-08-27 DIAGNOSIS — Z6835 Body mass index (BMI) 35.0-35.9, adult: Secondary | ICD-10-CM | POA: Diagnosis not present

## 2017-08-27 DIAGNOSIS — N912 Amenorrhea, unspecified: Secondary | ICD-10-CM

## 2017-08-27 MED ORDER — PHENTERMINE HCL 37.5 MG PO TABS: 37.5000 mg | ORAL_TABLET | Freq: Every day | ORAL | 0 refills | Status: DC

## 2017-08-27 MED ORDER — MEDROXYPROGESTERONE ACETATE 10 MG PO TABS: 10.0000 mg | ORAL_TABLET | Freq: Every day | ORAL | 0 refills | Status: DC

## 2017-08-27 NOTE — Progress Notes (Signed)
Gynecology Office Visit  Chief Complaint:  Chief Complaint  Patient presents with  . Follow-up    weight loss medication    History of Present Illness: Patientis a 32 y.o. 571P1001 female, who presents for the evaluation of the desire to lose weight. She has lost 3 pounds 1 months. The patient states the following symptoms since starting her weight loss therapy: appetite suppression, energy, and weight loss.  The patient also reports no other ill effects. The patient specifically denies heart palpitations, anxiety, and insomnia.    Review of Systems: 10 point review of systems negative unless otherwise noted in HPI  Past Medical History:  Past Medical History:  Diagnosis Date  . Abnormal Pap smear of cervix    09-17-2011 ASCUS +HPV,12/2012 ASC-US  . Migraines   . Oligomenorrhea   . STD (sexually transmitted disease)    HPV    Past Surgical History:  Past Surgical History:  Procedure Laterality Date  . CESAREAN SECTION N/A 05/23/2016   Procedure: CESAREAN SECTION;  Surgeon: Vena AustriaAndreas Kyrielle Urbanski, MD;  Location: [facility] ORS;  Service: Obstetrics;  Laterality: N/A;  . COLPOSCOPY  06-05-2012   ECC neg    Gynecologic History: Patient's last menstrual period was 07/27/2017.  Obstetric History: G1P1001  Family History:  Family History  Problem Relation Age of Onset  . Tongue cancer Mother   . Thyroid disease Mother   . Diabetes Paternal Uncle   . Diabetes Paternal Grandmother   . Stroke Paternal Grandmother   . Diabetes Paternal Grandfather   . Thyroid disease Maternal Grandmother   . Heart disease Maternal Grandfather   . Diabetes Paternal Aunt     Social History:  Social History   Socioeconomic History  . Marital status: Married    Spouse name: Not on file  . Number of children: Not on file  . Years of education: Not on file  . Highest education level: Not on file  Occupational History  . Not on file  Social Needs  . Financial resource strain: Not on file  . Food  insecurity:    Worry: Not on file    Inability: Not on file  . Transportation needs:    Medical: Not on file    Non-medical: Not on file  Tobacco Use  . Smoking status: Never Smoker  . Smokeless tobacco: Never Used  Substance and Sexual Activity  . Alcohol use: Yes    Alcohol/week: 0.0 - 0.6 oz  . Drug use: No  . Sexual activity: Yes    Partners: Male    Birth control/protection: None  Lifestyle  . Physical activity:    Days per week: Not on file    Minutes per session: Not on file  . Stress: Not on file  Relationships  . Social connections:    Talks on phone: Not on file    Gets together: Not on file    Attends religious service: Not on file    Active member of club or organization: Not on file    Attends meetings of clubs or organizations: Not on file    Relationship status: Not on file  . Intimate partner violence:    Fear of current or ex partner: Not on file    Emotionally abused: Not on file    Physically abused: Not on file    Forced sexual activity: Not on file  Other Topics Concern  . Not on file  Social History Narrative  . Not on file  Allergies:  No Known Allergies  Medications: Prior to Admission medications   Medication Sig Start Date End Date Taking? Authorizing Provider  phentermine (ADIPEX-P) 37.5 MG tablet Take 1 tablet (37.5 mg total) by mouth daily before breakfast. 07/30/17   Vena Austria, MD    Physical Exam Blood pressure 118/74, pulse 81, weight 189 lb (85.7 kg), last menstrual period 07/27/2017, not currently breastfeeding. Wt Readings from Last 3 Encounters:  08/27/17 189 lb (85.7 kg)  07/30/17 192 lb (87.1 kg)  05/21/16 214 lb (97.1 kg)   Body mass index is 35.71 kg/m.  General: NAD HEENT: normocephalic, anicteric Thyroid: no enlargement Pulmonary: no increased work of breathing Neurologic: Grossly intact Psychiatric: mood appropriate, affect full  Assessment: 32 y.o. G1P1001 presenting for weight loss management  follow up  Plan: Problem List Items Addressed This Visit    None    Visit Diagnoses    BMI 35.0-35.9,adult    -  Primary   Amenorrhea          1) 1500 Calorie ADA Diet  2) Patient education given regarding appropriate lifestyle changes for weight loss including: regular physical activity, healthy coping strategies, caloric restriction and healthy eating patterns.  3) Patient will be started on weight loss medication. The risks and benefits and side effects of medication, such as Adipex (Phenteramine) ,  Tenuate (Diethylproprion), Belviq (lorcarsin), Contrave (buproprion/naltrexone), Qsymia (phentermine/topiramate), and Saxenda (liraglutide) is discussed. The pros and cons of suppressing appetite and boosting metabolism is discussed. Risks of tolerence and addiction is discussed for selected agents discussed. Use of medicine will ne short term, such as 3-4 months at a time followed by a period of time off of the medicine to avoid these risks and side effects for Adipex, Qsymia, and Tenuate discussed. Pt to call with any negative side effects and agrees to keep follow up appts.  4) Patient to take medication, with the benefits of appetite suppression and metabolism boost d/w pt, along with the side effects and risk factors of long term use that will be avoided with our use of short bursts of therapy. Rx provided.    5) 15 minutes face-to-face; with counseling/coordination of care > 50 percent of visit related to obesity and ongoing management/treatment   6) Amenorrhea - still no spontaneous menstrual cycle.  Rx provera for withdrawal bleed, phentermine rx  7) Return in about 1 month (around 09/24/2017) for medication follow up.    Vena Austria, MD, [facility], [facility] 08/27/2017, 4:31 PM

## 2017-09-26 ENCOUNTER — Encounter: Payer: Self-pay | Admitting: Obstetrics and Gynecology

## 2017-09-26 ENCOUNTER — Ambulatory Visit: Payer: BLUE CROSS/BLUE SHIELD | Admitting: Obstetrics and Gynecology

## 2017-09-26 VITALS — BP 116/68 | Ht 61.0 in | Wt 191.0 lb

## 2017-09-26 DIAGNOSIS — E669 Obesity, unspecified: Secondary | ICD-10-CM

## 2017-09-26 DIAGNOSIS — Z6836 Body mass index (BMI) 36.0-36.9, adult: Secondary | ICD-10-CM

## 2017-09-26 IMAGING — CT CT ABD-PELV W/ CM
2 of 4 series · 16 of 46 positions shown, 18 images · IV contrast (iopamidol)
Comparison: None.

CLINICAL DATA: Acute right lower quadrant abdominal pain.

EXAM:
CT ABDOMEN AND PELVIS WITH CONTRAST
TECHNIQUE: Multidetector CT imaging of the abdomen and pelvis was performed
using the standard protocol following bolus administration of
intravenous contrast.
CONTRAST:  100mL [id number] IOPAMIDOL (VZA9EN-A55) INJECTION 61%

[Series 2: routine abd pel with · axial · 0.76mm/px · z∈[-424,-14]mm · 13 of 90 slices shown, 15 images]
[im 4/90  soft-tissue]
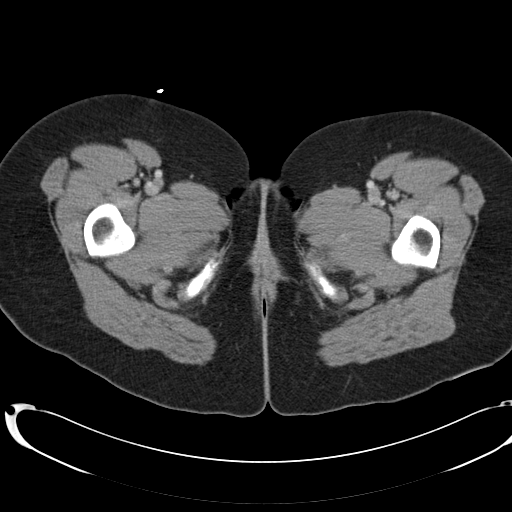
[im 4/90  bone]
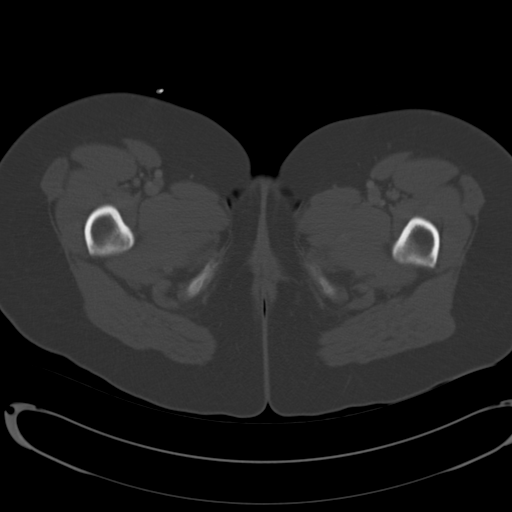
[im 12/90  soft-tissue]
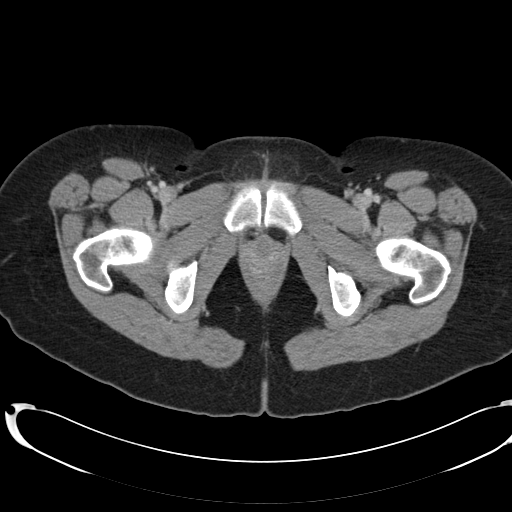
[im 19/90  soft-tissue]
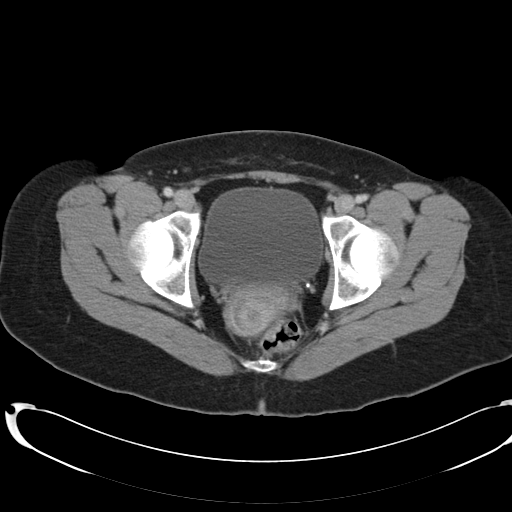
[im 26/90  soft-tissue]
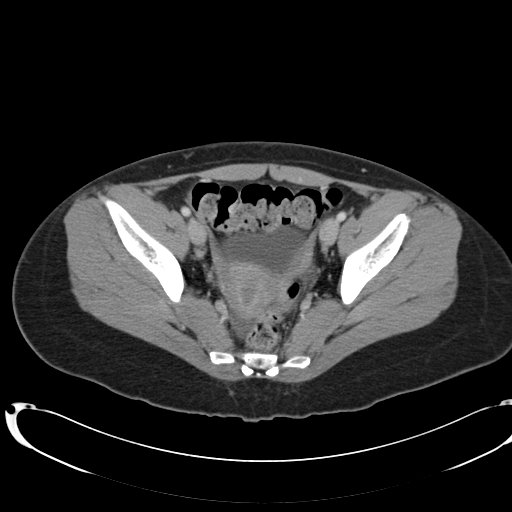
[im 30/90  soft-tissue]
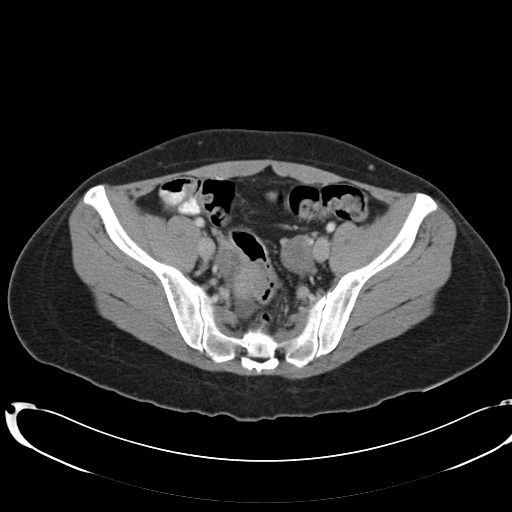
[im 38/90  soft-tissue]
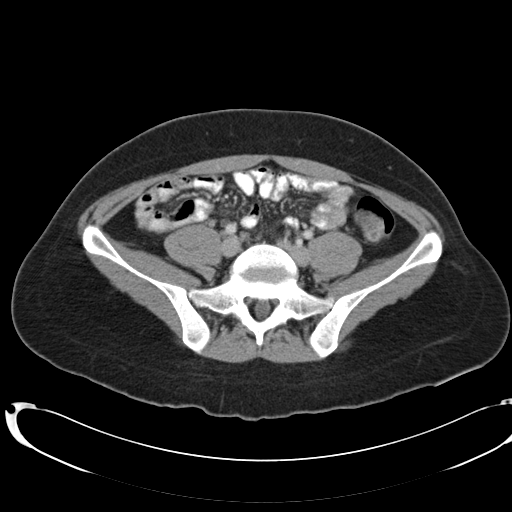
[im 45/90  soft-tissue]
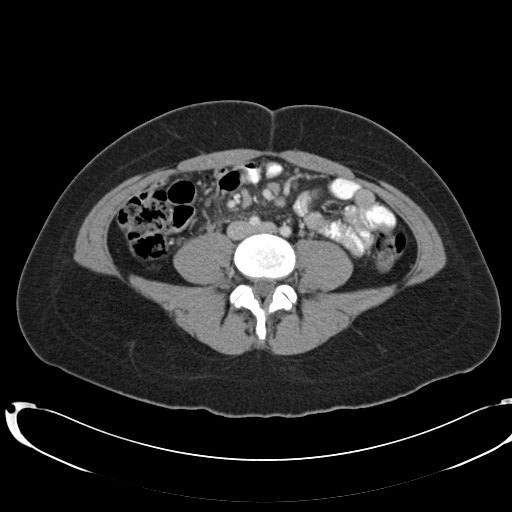
[im 52/90  soft-tissue]
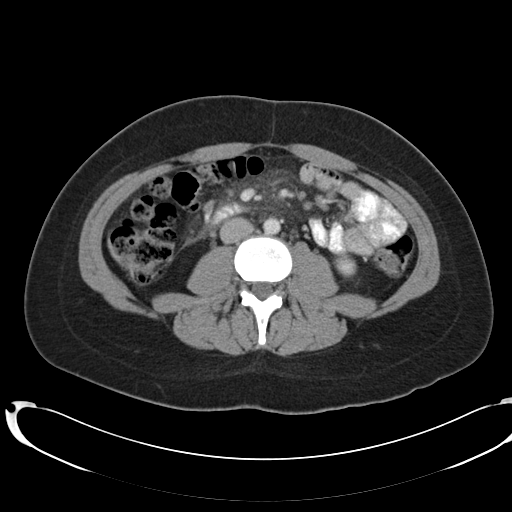
[im 60/90  soft-tissue]
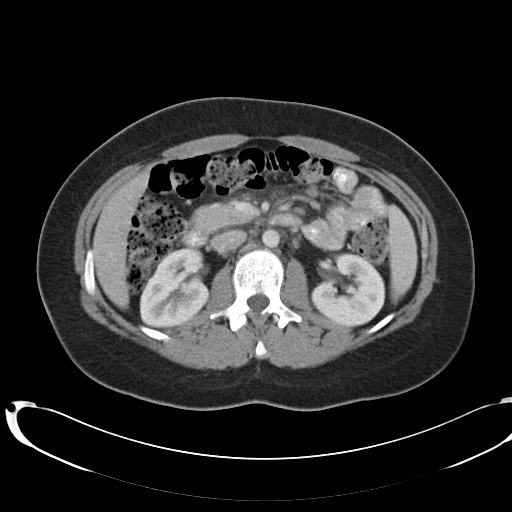
[im 60/90  bone]
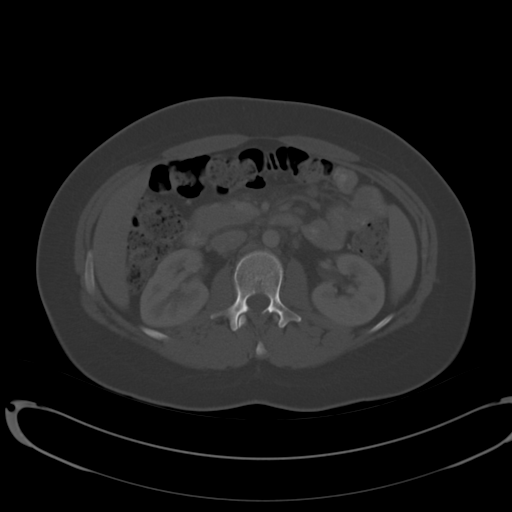
[im 64/90  soft-tissue]
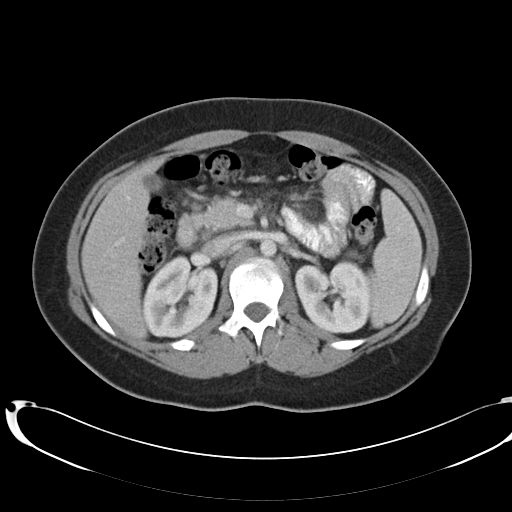
[im 71/90  soft-tissue]
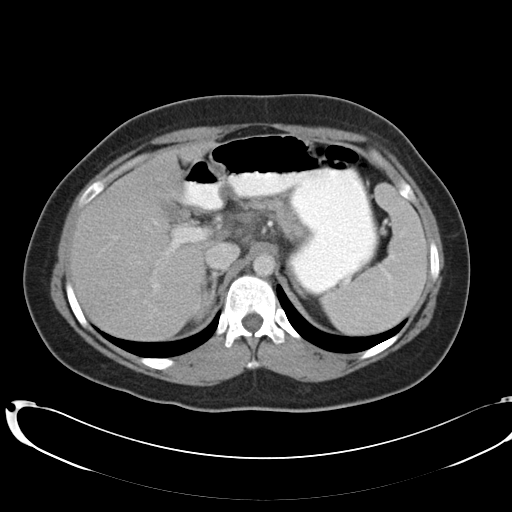
[im 78/90  soft-tissue]
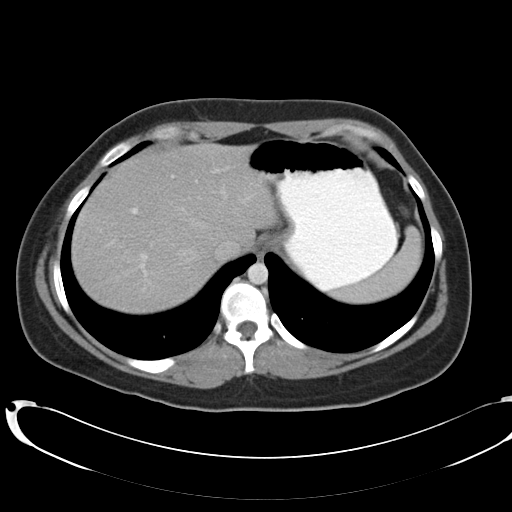
[im 86/90  soft-tissue]
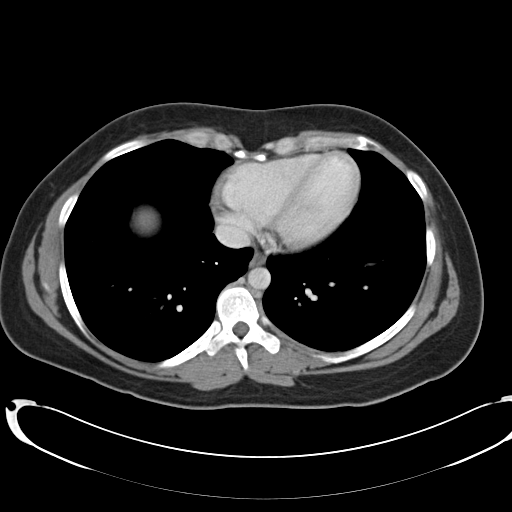

[Series 5: cor routine abd pel with · coronal · 0.64mm/px · 3 of 112 slices shown]
[im 38/112  soft-tissue]
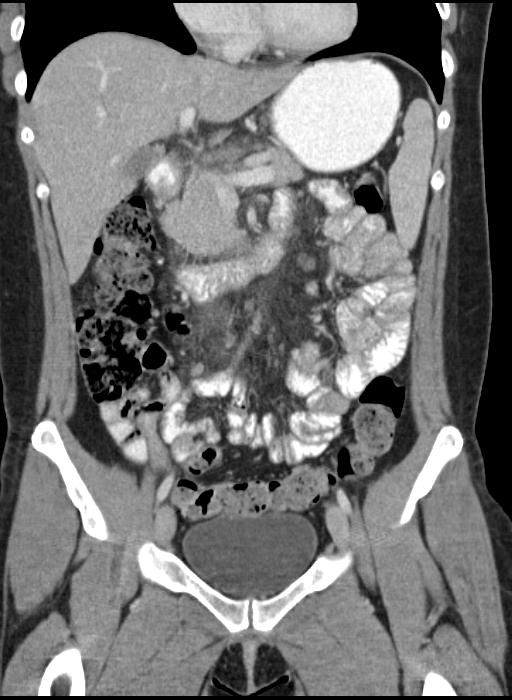
[im 50/112  soft-tissue]
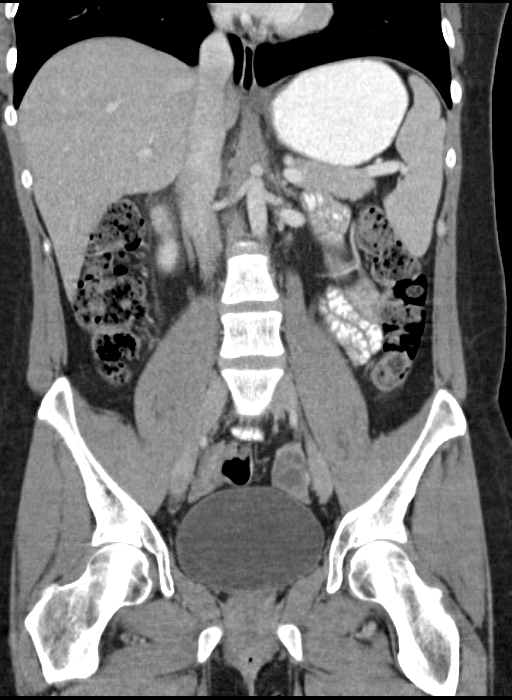
[im 62/112  soft-tissue]
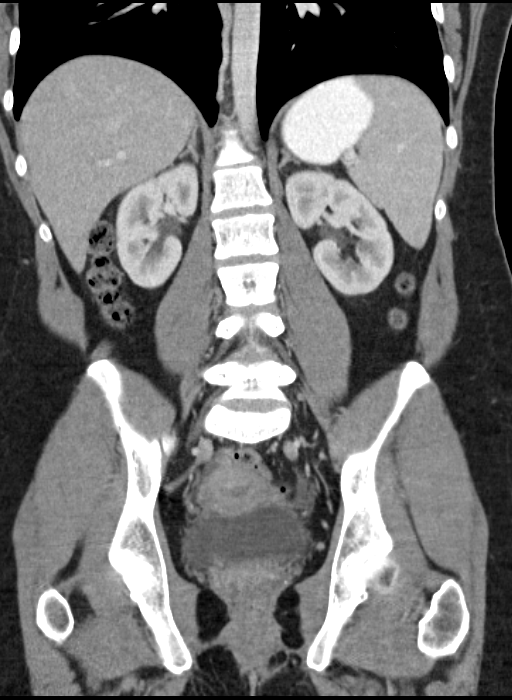

[16 of 46 positions shown; findings below may reference images not displayed]

FINDINGS: Visualized lung bases are unremarkable. No significant osseous
abnormality is noted.

No gallstones are noted. The liver, spleen and pancreas are
unremarkable. There is no evidence of bowel obstruction. The
visualized appendix appears normal. Urinary bladder appears normal.
Uterus and ovaries are unremarkable. Mild amount of free fluid is
noted in the pelvis which most likely is physiologic. Urinary
bladder appears normal. Mildly enlarged mesenteric lymph nodes are
noted, most likely inflammatory or reactive in etiology.
IMPRESSION: Mildly enlarged mesenteric lymph nodes are noted with mild
inflammatory stranding present most consistent with inflammatory or
reactive etiology. No other significant abnormality seen in the
abdomen or pelvis.

## 2017-09-26 MED ORDER — LORCASERIN HCL ER 20 MG PO TB24: 1.0000 | ORAL_TABLET | Freq: Every day | ORAL | 2 refills | Status: DC

## 2017-09-26 NOTE — Progress Notes (Signed)
Gynecology Office Visit  Chief Complaint:  Chief Complaint  Patient presents with  . Medication follow up    Weight loss    History of Present Illness: Patientis a 32 y.o. G68P1001 female, who presents for the evaluation of the desire to lose weight. She has gained 3lbs. The patient states the since starting her weight loss therapy she has not noted significant appetite suppression, energy, and weight loss.  The patient also reports no other ill effects. The patient specifically denies heart palpitations, anxiety, and insomnia.    Review of Systems: 10 point review of systems negative unless otherwise noted in HPI  Past Medical History:  Past Medical History:  Diagnosis Date  . Abnormal Pap smear of cervix    09-17-2011 ASCUS +HPV,12/2012 ASC-US  . Migraines   . Oligomenorrhea   . STD (sexually transmitted disease)    HPV    Past Surgical History:  Past Surgical History:  Procedure Laterality Date  . CESAREAN SECTION N/A 05/23/2016   Procedure: CESAREAN SECTION;  Surgeon: Vena Austria, MD;  Location: [facility] ORS;  Service: Obstetrics;  Laterality: N/A;  . COLPOSCOPY  06-05-2012   ECC neg    Gynecologic History: Patient's last menstrual period was 09/07/2017 (exact date).  Obstetric History: G1P1001  Family History:  Family History  Problem Relation Age of Onset  . Tongue cancer Mother   . Thyroid disease Mother   . Diabetes Paternal Uncle   . Diabetes Paternal Grandmother   . Stroke Paternal Grandmother   . Diabetes Paternal Grandfather   . Thyroid disease Maternal Grandmother   . Heart disease Maternal Grandfather   . Diabetes Paternal Aunt     Social History:  Social History   Socioeconomic History  . Marital status: Married    Spouse name: Not on file  . Number of children: Not on file  . Years of education: Not on file  . Highest education level: Not on file  Occupational History  . Not on file  Social Needs  . Financial resource strain: Not on  file  . Food insecurity:    Worry: Not on file    Inability: Not on file  . Transportation needs:    Medical: Not on file    Non-medical: Not on file  Tobacco Use  . Smoking status: Never Smoker  . Smokeless tobacco: Never Used  Substance and Sexual Activity  . Alcohol use: Yes    Alcohol/week: 0.0 - 0.6 oz  . Drug use: No  . Sexual activity: Yes    Partners: Male    Birth control/protection: None  Lifestyle  . Physical activity:    Days per week: Not on file    Minutes per session: Not on file  . Stress: Not on file  Relationships  . Social connections:    Talks on phone: Not on file    Gets together: Not on file    Attends religious service: Not on file    Active member of club or organization: Not on file    Attends meetings of clubs or organizations: Not on file    Relationship status: Not on file  . Intimate partner violence:    Fear of current or ex partner: Not on file    Emotionally abused: Not on file    Physically abused: Not on file    Forced sexual activity: Not on file  Other Topics Concern  . Not on file  Social History Narrative  . Not on file  Allergies:  No Known Allergies  Medications: Prior to Admission medications   Medication Sig Start Date End Date Taking? Authorizing Provider  phentermine (ADIPEX-P) 37.5 MG tablet Take 1 tablet (37.5 mg total) by mouth daily before breakfast. 08/27/17   Vena AustriaStaebler, Daleena Rotter, MD    Physical Exam Blood pressure 116/68, height 5\' 1"  (1.549 m), weight 191 lb (86.6 kg), last menstrual period 09/07/2017, not currently breastfeeding. Wt Readings from Last 3 Encounters:  09/26/17 191 lb (86.6 kg)  08/27/17 189 lb (85.7 kg)  07/30/17 192 lb (87.1 kg)  Body mass index is 36.09 kg/m.   General: NAD HEENT: normocephalic, anicteric Thyroid: no enlargement Pulmonary: no increased work of breathing Neurologic: Grossly intact Psychiatric: mood appropriate, affect full  Assessment: 32 y.o. G1P1001 No  problem-specific Assessment & Plan notes found for this encounter.   Plan: Problem List Items Addressed This Visit    None    Visit Diagnoses    Class 2 obesity without serious comorbidity with body mass index (BMI) of 36.0 to 36.9 in adult, unspecified obesity type    -  Primary   Relevant Medications   Lorcaserin HCl ER (BELVIQ XR) 20 MG TB24      1) 1500 Calorie ADA Diet  2) Patient education given regarding appropriate lifestyle changes for weight loss including: regular physical activity, healthy coping strategies, caloric restriction and healthy eating patterns.  3) Discontinue phentermine  4) Patient to take medication, with the benefits of appetite suppression and metabolism boost d/w pt, along with the side effects and risk factors of long term use that will be avoided with our use of short bursts of therapy. Rx provided switching to Belviq.    5) 15 minutes face-to-face; with counseling/coordination of care > 50 percent of visit related to obesity and ongoing management/treatment   6)  Return in about 3 months (around 12/27/2017) for medication follow up.    Vena AustriaAndreas Gerardo Caiazzo, MD, Evern CoreFAC[facility], [facility] 09/26/2017, 4:04 PM

## 2017-10-29 ENCOUNTER — Other Ambulatory Visit (HOSPITAL_COMMUNITY)
Admission: RE | Admit: 2017-10-29 | Discharge: 2017-10-29 | Disposition: A | Discharge status: Home / Self Care | Payer: BLUE CROSS/BLUE SHIELD | Source: Ambulatory Visit | Attending: Obstetrics and Gynecology | Admitting: Obstetrics and Gynecology

## 2017-10-29 ENCOUNTER — Ambulatory Visit (INDEPENDENT_AMBULATORY_CARE_PROVIDER_SITE_OTHER): Payer: BLUE CROSS/BLUE SHIELD | Admitting: Obstetrics and Gynecology

## 2017-10-29 ENCOUNTER — Encounter: Payer: Self-pay | Admitting: Obstetrics and Gynecology

## 2017-10-29 VITALS — BP 104/68 | HR 94 | Wt 189.0 lb

## 2017-10-29 DIAGNOSIS — Z113 Encounter for screening for infections with a predominantly sexual mode of transmission: Secondary | ICD-10-CM | POA: Diagnosis present

## 2017-10-29 DIAGNOSIS — Z98891 History of uterine scar from previous surgery: Secondary | ICD-10-CM

## 2017-10-29 DIAGNOSIS — O99210 Obesity complicating pregnancy, unspecified trimester: Secondary | ICD-10-CM

## 2017-10-29 DIAGNOSIS — O34219 Maternal care for unspecified type scar from previous cesarean delivery: Secondary | ICD-10-CM

## 2017-10-29 DIAGNOSIS — Z3480 Encounter for supervision of other normal pregnancy, unspecified trimester: Secondary | ICD-10-CM | POA: Insufficient documentation

## 2017-10-29 DIAGNOSIS — O99211 Obesity complicating pregnancy, first trimester: Secondary | ICD-10-CM

## 2017-10-29 DIAGNOSIS — Z3689 Encounter for other specified antenatal screening: Secondary | ICD-10-CM

## 2017-10-29 DIAGNOSIS — Z3201 Encounter for pregnancy test, result positive: Secondary | ICD-10-CM | POA: Diagnosis not present

## 2017-10-29 DIAGNOSIS — Z3A01 Less than 8 weeks gestation of pregnancy: Secondary | ICD-10-CM

## 2017-10-29 DIAGNOSIS — N912 Amenorrhea, unspecified: Secondary | ICD-10-CM

## 2017-10-29 LAB — POCT URINALYSIS DIPSTICK OB
Glucose, UA: NEGATIVE — AB
POC,PROTEIN,UA: NEGATIVE

## 2017-10-29 LAB — POCT URINE PREGNANCY: PREG TEST UR: POSITIVE — AB

## 2017-10-29 NOTE — Progress Notes (Signed)
New Obstetric Patient H&P    Chief Complaint: "Desires prenatal care"   History of Present Illness: Patient is a 32 y.o. G2P1001 Not Hispanic or Latino female, presents with amenorrhea and positive home pregnancy test. Patient's last menstrual period was 09/05/2017 (exact date). and based on her  LMP, her Estimated Date of Delivery: 06/12/18 giving a gestational age of [redacted]w[redacted]d.  Her last pap smear was 4 monts ago and was no abnormalities.    She had a urine pregnancy test which was positive 1 week(s)  ago. Her last menstrual period was normal. Since her LMP she claims she has experienced nausea, fatigue, breast tenderness. She denies vaginal bleeding. Her past medical history is noncontributory. Her prior pregnancies are notable for cesarean section  Since her LMP, she admits to the use of tobacco products  no She claims she has gained   no pounds since the start of her pregnancy.  There are cats in the home in the home  no  She admits close contact with children on a regular basis  no  She has had chicken pox in the past no She has had Tuberculosis exposures, symptoms, or previously tested positive for TB   no Current or past history of domestic violence. no  Genetic Screening/Teratology Counseling: (Includes patient, baby's father, or anyone in either family with:)   1. Patient's age >/= 43 at [facility]  no 2. Thalassemia ([place], [place], Mediterranean, or Asian background): MCV<80  no 3. Neural tube defect (meningomyelocele, spina bifida, anencephaly)  no 4. Congenital heart defect  no  5. Down syndrome  no 6. Tay-Sachs (Jewish, [place]  no 7. Canavan's Disease  no 8. Sickle cell disease or trait (African)  no  9. Hemophilia or other blood disorders  no  10. Muscular dystrophy  no  11. Cystic fibrosis  no  12. Huntington's Chorea  no  13. Mental retardation/autism  no 14. Other inherited genetic or chromosomal disorder  no 15. Maternal metabolic disorder (DM, PKU, etc)   no 16. Patient or FOB with a child with a birth defect not listed above no  16a. Patient or FOB with a birth defect themselves no 17. Recurrent pregnancy loss, or stillbirth  no  18. Any medications since LMP other than prenatal vitamins (include vitamins, supplements, OTC meds, drugs, alcohol)  no 19. Any other genetic/environmental exposure to discuss  no  Infection History:   1. Lives with someone with TB or TB exposed  no  2. Patient or partner has history of genital herpes  no 3. Rash or viral illness since LMP  no 4. History of STI (GC, CT, HPV, syphilis, HIV)  no 5. History of recent travel :  no  Other pertinent information:  no     Review of Systems:10 point review of systems negative unless otherwise noted in HPI  Past Medical History:  Past Medical History:  Diagnosis Date  . Abnormal Pap smear of cervix    09-17-2011 ASCUS +HPV,12/2012 ASC-US  . Migraines   . Oligomenorrhea   . STD (sexually transmitted disease)    HPV    Past Surgical History:  Past Surgical History:  Procedure Laterality Date  . CESAREAN SECTION N/A 05/23/2016   Procedure: CESAREAN SECTION;  Surgeon: Vena Austria, MD;  Location: [facility] ORS;  Service: Obstetrics;  Laterality: N/A;  . COLPOSCOPY  06-05-2012   ECC neg    Gynecologic History: Patient's last menstrual period was 09/05/2017 (exact date).  Obstetric History: G2P1001  Family History:  Family History  Problem Relation Age of Onset  . Tongue cancer Mother   . Thyroid disease Mother   . Diabetes Paternal Uncle   . Diabetes Paternal Grandmother   . Stroke Paternal Grandmother   . Diabetes Paternal Grandfather   . Thyroid disease Maternal Grandmother   . Heart disease Maternal Grandfather   . Diabetes Paternal Aunt     Social History:  Social History   Socioeconomic History  . Marital status: Married    Spouse name: Not on file  . Number of children: Not on file  . Years of education: Not on file  . Highest education  level: Not on file  Occupational History  . Not on file  Social Needs  . Financial resource strain: Not on file  . Food insecurity:    Worry: Not on file    Inability: Not on file  . Transportation needs:    Medical: Not on file    Non-medical: Not on file  Tobacco Use  . Smoking status: Never Smoker  . Smokeless tobacco: Never Used  Substance and Sexual Activity  . Alcohol use: Yes    Alcohol/week: 0.0 - 1.0 standard drinks  . Drug use: No  . Sexual activity: Yes    Partners: Male    Birth control/protection: None  Lifestyle  . Physical activity:    Days per week: Not on file    Minutes per session: Not on file  . Stress: Not on file  Relationships  . Social connections:    Talks on phone: Not on file    Gets together: Not on file    Attends religious service: Not on file    Active member of club or organization: Not on file    Attends meetings of clubs or organizations: Not on file    Relationship status: Not on file  . Intimate partner violence:    Fear of current or ex partner: Not on file    Emotionally abused: Not on file    Physically abused: Not on file    Forced sexual activity: Not on file  Other Topics Concern  . Not on file  Social History Narrative  . Not on file    Allergies:  No Known Allergies  Medications: Prior to Admission medications   Medication Sig Start Date End Date Taking? Authorizing Provider  Lorcaserin HCl ER (BELVIQ XR) 20 MG TB24 Take 1 tablet by mouth daily. 09/26/17   Vena AustriaStaebler, [place] Oborn, MD    Physical Exam Vitals: Blood pressure 104/68, pulse 94, weight 189 lb (85.7 kg), last menstrual period 09/05/2017, not currently breastfeeding. Body mass index is 35.71 kg/m.   General: NAD HEENT: normocephalic, anicteric Pulmonary: No increased work of breathing Abdomen: soft, non-tender, non-distended.  Umbilicus without lesions.  No hepatomegaly, splenomegaly or masses palpable. No evidence of hernia  Genitourinary:  External:  Normal external female genitalia.  Normal urethral meatus, normal  Bartholin's and Skene's glands.    Vagina: Normal vaginal mucosa, no evidence of prolapse.    Cervix: Grossly normal in appearance, no bleeding  Uterus:  Non-enlarged, mobile, normal contour.  No CMT  Adnexa: ovaries non-enlarged, no adnexal masses  Rectal: deferred Extremities: no edema, erythema, or tenderness Neurologic: Grossly intact Psychiatric: mood appropriate, affect full   Assessment: 32 y.o. G2P1001 at Unknown presenting to initiate prenatal care  Plan: 1) Avoid alcoholic beverages. 2) Patient encouraged not to smoke.  3) Discontinue the use of all non-medicinal drugs and chemicals.  4) Take prenatal vitamins  daily.  5) Nutrition, food safety (fish, cheese advisories, and high nitrite foods) and exercise discussed. 6) Hospital and practice style discussed with cross coverage system.  7) Genetic Screening, such as with 1st Trimester Screening, cell free fetal DNA, AFP testing, and Ultrasound, as well as with amniocentesis and CVS as appropriate, is discussed with patient. At the conclusion of today's visit patient undecided genetic testing   Vena AustriaAndreas Yamari Ventola, MD, Merlinda FrederickFACOG [facility], [facility] 10/29/2017, 9:11 AM

## 2017-10-29 NOTE — Progress Notes (Signed)
NOB 

## 2017-10-30 LAB — HEMOGLOBIN A1C
ESTIMATED AVERAGE GLUCOSE: 97 mg/dL
Hgb A1c MFr Bld: 5 % (ref 4.8–5.6)

## 2017-10-30 LAB — RPR+RH+ABO+RUB AB+AB SCR+CB...
Antibody Screen: NEGATIVE
HEMATOCRIT: 41 % (ref 34.0–46.6)
HIV SCREEN 4TH GENERATION: NONREACTIVE
Hemoglobin: 14 g/dL (ref 11.1–15.9)
Hepatitis B Surface Ag: NEGATIVE
MCH: 31.3 pg (ref 26.6–33.0)
MCHC: 34.1 g/dL (ref 31.5–35.7)
MCV: 92 fL (ref 79–97)
PLATELETS: 255 10*3/uL (ref 150–450)
RBC: 4.48 x10E6/uL (ref 3.77–5.28)
RDW: 13.5 % (ref 12.3–15.4)
RH TYPE: POSITIVE
RPR: NONREACTIVE
Rubella Antibodies, IGG: 11 index (ref >0.99)
Varicella zoster IgG: 365 index (ref >165)
WBC: 7.3 10*3/uL (ref 3.4–10.8)

## 2017-10-30 LAB — CERVICOVAGINAL ANCILLARY ONLY
CHLAMYDIA, DNA PROBE: NEGATIVE
Neisseria Gonorrhea: NEGATIVE

## 2017-10-31 LAB — URINE CULTURE: Organism ID, Bacteria: NO GROWTH

## 2017-11-05 ENCOUNTER — Ambulatory Visit (INDEPENDENT_AMBULATORY_CARE_PROVIDER_SITE_OTHER): Payer: BLUE CROSS/BLUE SHIELD

## 2017-11-05 ENCOUNTER — Ambulatory Visit (INDEPENDENT_AMBULATORY_CARE_PROVIDER_SITE_OTHER): Payer: BLUE CROSS/BLUE SHIELD | Admitting: Obstetrics and Gynecology

## 2017-11-05 VITALS — BP 116/68 | Wt 192.0 lb

## 2017-11-05 DIAGNOSIS — O3680X0 Pregnancy with inconclusive fetal viability, not applicable or unspecified: Secondary | ICD-10-CM | POA: Diagnosis not present

## 2017-11-05 DIAGNOSIS — Z3480 Encounter for supervision of other normal pregnancy, unspecified trimester: Secondary | ICD-10-CM

## 2017-11-05 DIAGNOSIS — O34219 Maternal care for unspecified type scar from previous cesarean delivery: Secondary | ICD-10-CM

## 2017-11-05 DIAGNOSIS — Z3A01 Less than 8 weeks gestation of pregnancy: Secondary | ICD-10-CM | POA: Diagnosis not present

## 2017-11-05 DIAGNOSIS — Z3689 Encounter for other specified antenatal screening: Secondary | ICD-10-CM

## 2017-11-05 DIAGNOSIS — Z3A08 8 weeks gestation of pregnancy: Secondary | ICD-10-CM

## 2017-11-05 DIAGNOSIS — Z3A09 9 weeks gestation of pregnancy: Secondary | ICD-10-CM

## 2017-11-05 DIAGNOSIS — O99211 Obesity complicating pregnancy, first trimester: Secondary | ICD-10-CM

## 2017-11-05 DIAGNOSIS — O99210 Obesity complicating pregnancy, unspecified trimester: Secondary | ICD-10-CM

## 2017-11-05 DIAGNOSIS — Z98891 History of uterine scar from previous surgery: Secondary | ICD-10-CM

## 2017-11-05 LAB — POCT URINALYSIS DIPSTICK OB
Glucose, UA: NEGATIVE — AB
PROTEIN: NEGATIVE

## 2017-11-05 NOTE — Progress Notes (Signed)
ROB Dating scan 

## 2017-11-10 NOTE — Progress Notes (Signed)
Routine Prenatal Care Visit  Subjective  Amber Graves is a 32 y.o. G2P1001 at 833w3d being seen today for ongoing prenatal care.  She is currently monitored for Amber Erikssonthe following issues for this low-risk pregnancy and has History of low transverse cesarean section; Maternal obesity, antepartum; and Supervision of other normal pregnancy, antepartum on their problem list.  ----------------------------------------------------------------------------------- Patient reports no complaints.    .  .   . Denies leaking of fluid.  ----------------------------------------------------------------------------------- The following portions of the patient's history were reviewed and updated as appropriate: allergies, current medications, past family history, past medical history, past social history, past surgical history and problem list. Problem list updated.   Objective  Blood pressure 116/68, weight 192 lb (87.1 kg), last menstrual period 09/05/2017, not currently breastfeeding. Pregravid weight 189 lb (85.7 kg) Total Weight Gain 3 lb (1.361 kg) Urinalysis:      Fetal Status:           General:  Alert, oriented and cooperative. Patient is in no acute distress.  Skin: Skin is warm and dry. No rash noted.   Cardiovascular: Normal heart rate noted  Respiratory: Normal respiratory effort, no problems with respiration noted  Abdomen: Soft, gravid, appropriate for gestational age.       Pelvic:  Cervical exam deferred        Extremities: Normal range of motion.     ental Status: Normal mood and affect. Normal behavior. Normal judgment and thought content.   [place] Ob Comp Less 14 Wks  Result Date: 11/06/2017 ULTRASOUND REPORT Patient Name: Amber ErikssonBrittany Graves DOB: Mar 02, 1986 MRN: [id number] Location: [facility] Date of Service: 11/05/2017 Indications:dating Findings: Mason JimSingleton intrauterine pregnancy is visualized with a CRL consistent with 621w1d gestation, giving an (U/S) EDD of 06/16/2018. The (U/S)  EDD is consistent with the clinically established EDD of 06/12/2018. FHR: 166 bpm CRL measurement: 17.2 mm Yolk sac is visualized and appears normal and early anatomy is normal. Amnion: not visualized Right Ovary is normal in appearance. Left Ovary is not identified. Corpus luteal cyst:  is not visualized Survey of the adnexa demonstrates no adnexal masses. There is no free peritoneal fluid in the cul de sac. Impression: 1. 4621w1d Viable Singleton Intrauterine pregnancy by U/S. 2. (U/S) EDD is consistent with Clinically established EDD of 06/12/2018. Recommendations: 1.Clinical correlation with the patient's History and Physical Exam. Willette AlmaKristen Priestley, RDMS, RVT There is a viable singleton gestation.  The fetal biometry correlates with established dating. Detailed evaluation of the fetal anatomy is precluded by early gestational age.  It must be noted that a normal ultrasound particular at this early gestational age is unable to rule out fetal aneuploidy, risk of first trimester miscarriage, or anatomic birth defects. Vena AustriaAndreas Keon Benscoter, MD, FACOG [facility], [facility]     Assessment   31 y.o. G2P1001 at 1333w3d by  06/12/2018, by Last Menstrual Period presenting for routine prenatal visit  Plan   Pregnancy#2 Problems (from 09/05/17 to present)    Problem Noted Resolved   History of low transverse cesarean section 10/29/2017 by Vena AustriaStaebler, Avion Kutzer, MD No   Maternal obesity, antepartum 10/29/2017 by Vena AustriaStaebler, Zayvien Canning, MD No   Supervision of other normal pregnancy, antepartum 10/29/2017 by Vena AustriaStaebler, Karlyn Glasco, MD No   Overview Addendum 10/31/2017  9:42 AM by Vena AustriaStaebler, Korvin Valentine, MD    [facility] Prenatal Labs  Dating LMP = 8 week US Blood type: AB positive  Genetic Screen Undecided Antibody:negative  Anatomic US  Rubella: Immune Varicella: Immune  GTT HgbA1C: 5.0  Third trimester:  RPR: NR  Rhogam N/A HBsAg: negative  TDaP vaccine [ ]  30 weeks Flu Shot: HIV: negative  Baby Food                                 GBS:   Contraception  Pap: 07/03/2016  CBB     CS/VBAC Repeat Cesarean   Support Person Jomarie Longs               Gestational age appropriate obstetric precautions including but not limited to vaginal bleeding, contractions, leaking of fluid and fetal movement were reviewed in detail with the patient.    Return in about 3 weeks (around 11/26/2017) for 3-4 week ROB.  Vena Austria, MD, Evern Core [facility], [facility] 11/10/2017, 1:26 PM

## 2017-11-27 ENCOUNTER — Encounter: Payer: Self-pay | Admitting: Obstetrics and Gynecology

## 2017-11-27 ENCOUNTER — Ambulatory Visit (INDEPENDENT_AMBULATORY_CARE_PROVIDER_SITE_OTHER): Payer: BLUE CROSS/BLUE SHIELD | Admitting: Obstetrics and Gynecology

## 2017-11-27 VITALS — BP 120/74 | Wt 194.0 lb

## 2017-11-27 DIAGNOSIS — Z31438 Encounter for other genetic testing of female for procreative management: Secondary | ICD-10-CM

## 2017-11-27 DIAGNOSIS — Z3A11 11 weeks gestation of pregnancy: Secondary | ICD-10-CM

## 2017-11-27 DIAGNOSIS — Z98891 History of uterine scar from previous surgery: Secondary | ICD-10-CM

## 2017-11-27 DIAGNOSIS — O99211 Obesity complicating pregnancy, first trimester: Secondary | ICD-10-CM

## 2017-11-27 DIAGNOSIS — Z3480 Encounter for supervision of other normal pregnancy, unspecified trimester: Secondary | ICD-10-CM

## 2017-11-27 DIAGNOSIS — O99210 Obesity complicating pregnancy, unspecified trimester: Secondary | ICD-10-CM

## 2017-11-27 DIAGNOSIS — Z1379 Encounter for other screening for genetic and chromosomal anomalies: Secondary | ICD-10-CM

## 2017-11-27 DIAGNOSIS — O34219 Maternal care for unspecified type scar from previous cesarean delivery: Secondary | ICD-10-CM

## 2017-11-27 LAB — POCT URINALYSIS DIPSTICK OB
Glucose, UA: NEGATIVE
POC,PROTEIN,UA: NEGATIVE

## 2017-11-27 MED ORDER — PROCHLORPERAZINE MALEATE 10 MG PO TABS: 10.0000 mg | ORAL_TABLET | Freq: Four times a day (QID) | ORAL | 3 refills | Status: DC | PRN

## 2017-11-27 NOTE — Progress Notes (Signed)
    Routine Prenatal Care Visit  Subjective  Amber Graves is a 32 y.o. G2P1001 at 7049w6d being seen today for ongoing prenatal care.  She is currently monitored for the following issues for this low-risk pregnancy and has History of low transverse cesarean section; Maternal obesity, antepartum; and Supervision of other normal pregnancy, antepartum on their problem list.  ----------------------------------------------------------------------------------- Patient reports headache.   Contractions: Not present. Vag. Bleeding: None.  Movement: Absent. Denies leaking of fluid.  ----------------------------------------------------------------------------------- The following portions of the patient's history were reviewed and updated as appropriate: allergies, current medications, past family history, past medical history, past social history, past surgical history and problem list. Problem list updated.   Objective  Blood pressure 120/74, weight 194 lb (88 kg), last menstrual period 09/05/2017, not currently breastfeeding. Pregravid weight 189 lb (85.7 kg) Total Weight Gain 5 lb (2.268 kg)  Body mass index is 36.66 kg/m.  Urinalysis:      Fetal Status: Fetal Heart Rate (bpm): 140   Movement: Absent     General:  Alert, oriented and cooperative. Patient is in no acute distress.  Skin: Skin is warm and dry. No rash noted.   Cardiovascular: Normal heart rate noted  Respiratory: Normal respiratory effort, no problems with respiration noted  Abdomen: Soft, gravid, appropriate for gestational age. Pain/Pressure: Absent     Pelvic:  Cervical exam deferred        Extremities: Normal range of motion.     ental Status: Normal mood and affect. Normal behavior. Normal judgment and thought content.     Assessment   31 y.o. G2P1001 at 4249w6d by  06/12/2018, by Last Menstrual Period presenting for routine prenatal visit  Plan   Pregnancy#2 Problems (from 09/05/17 to present)    Problem Noted  Resolved   History of low transverse cesarean section 10/29/2017 by Vena AustriaStaebler, Garry Nicolini, MD No   Maternal obesity, antepartum 10/29/2017 by Vena AustriaStaebler, Rynn Markiewicz, MD No   Supervision of other normal pregnancy, antepartum 10/29/2017 by Vena AustriaStaebler, Saniah Schroeter, MD No   Overview Addendum 11/10/2017  1:27 PM by Vena AustriaStaebler, Ahlaya Ende, MD    [facility] Prenatal Labs  Dating LMP = 8 week US Blood type: AB positive  Genetic Screen Undecided Antibody:negative  Anatomic US  Rubella: Immune Varicella: Immune  GTT HgbA1C: 5.0 Third trimester:  RPR: NR  Rhogam N/A HBsAg: negative  TDaP vaccine [ ]  30 weeks Flu Shot: HIV: negative  Baby Food                                GBS:   Contraception  Pap: 07/03/2016  CBB     CS/VBAC Repeat Cesarean   Support Person Jomarie LongsJoseph               Gestational age appropriate obstetric precautions including but not limited to vaginal bleeding, contractions, leaking of fluid and fetal movement were reviewed in detail with the patient.    - maternity 21 and inheritest today - compazine Rx for headaches  Return in about 2 weeks (around 12/11/2017) for ROB.  Vena AustriaAndreas Jersey Espinoza, MD, Merlinda FrederickFACOG [facility], [facility]

## 2017-11-27 NOTE — Progress Notes (Signed)
ROB Migraines once a week lasting 2 days

## 2017-12-02 LAB — MATERNIT 21 PLUS CORE, BLOOD
Chromosome 13: NEGATIVE
Chromosome 18: NEGATIVE
Chromosome 21: NEGATIVE
Y Chromosome: DETECTED

## 2017-12-08 LAB — INHERITEST CORE(CF97,SMA,FRAX)

## 2017-12-11 ENCOUNTER — Ambulatory Visit (INDEPENDENT_AMBULATORY_CARE_PROVIDER_SITE_OTHER): Payer: BLUE CROSS/BLUE SHIELD | Admitting: Obstetrics and Gynecology

## 2017-12-11 VITALS — BP 118/72 | Wt 196.0 lb

## 2017-12-11 DIAGNOSIS — O99211 Obesity complicating pregnancy, first trimester: Secondary | ICD-10-CM

## 2017-12-11 DIAGNOSIS — Z3480 Encounter for supervision of other normal pregnancy, unspecified trimester: Secondary | ICD-10-CM

## 2017-12-11 DIAGNOSIS — Z363 Encounter for antenatal screening for malformations: Secondary | ICD-10-CM

## 2017-12-11 DIAGNOSIS — O34219 Maternal care for unspecified type scar from previous cesarean delivery: Secondary | ICD-10-CM

## 2017-12-11 DIAGNOSIS — Z3A13 13 weeks gestation of pregnancy: Secondary | ICD-10-CM

## 2017-12-11 DIAGNOSIS — O99210 Obesity complicating pregnancy, unspecified trimester: Secondary | ICD-10-CM

## 2017-12-11 DIAGNOSIS — Z98891 History of uterine scar from previous surgery: Secondary | ICD-10-CM

## 2017-12-11 LAB — POCT URINALYSIS DIPSTICK OB
GLUCOSE, UA: NEGATIVE
POC,PROTEIN,UA: NEGATIVE

## 2017-12-11 NOTE — Progress Notes (Signed)
    Routine Prenatal Care Visit  Subjective  Amber Graves is a 32 y.o. G2P1001 at [redacted]w[redacted]d being seen today for ongoing prenatal care.  She is currently monitored for the following issues for this high-risk pregnancy and has History of low transverse cesarean section; Maternal obesity, antepartum; and Supervision of other normal pregnancy, antepartum on their problem list.  ----------------------------------------------------------------------------------- Patient reports no complaints.   Contractions: Not present. Vag. Bleeding: None.  Movement: Absent. Denies leaking of fluid.  ----------------------------------------------------------------------------------- The following portions of the patient's history were reviewed and updated as appropriate: allergies, current medications, past family history, past medical history, past social history, past surgical history and problem list. Problem list updated.   Objective  Blood pressure 118/72, weight 196 lb (88.9 kg), last menstrual period 09/05/2017, not currently breastfeeding. Pregravid weight 189 lb (85.7 kg) Total Weight Gain 7 lb (3.175 kg) Urinalysis:      Fetal Status: Fetal Heart Rate (bpm): 145   Movement: Absent     General:  Alert, oriented and cooperative. Patient is in no acute distress.  Skin: Skin is warm and dry. No rash noted.   Cardiovascular: Normal heart rate noted  Respiratory: Normal respiratory effort, no problems with respiration noted  Abdomen: Soft, gravid, appropriate for gestational age. Pain/Pressure: Absent     Pelvic:  Cervical exam deferred        Extremities: Normal range of motion.     ental Status: Normal mood and affect. Normal behavior. Normal judgment and thought content.     Assessment   31 y.o. G2P1001 at [redacted]w[redacted]d by  06/12/2018, by Last Menstrual Period presenting for routine prenatal visit  Plan   Pregnancy#2 Problems (from 09/05/17 to present)    Problem Noted Resolved   History of low  transverse cesarean section 10/29/2017 by Vena Austria, MD No   Maternal obesity, antepartum 10/29/2017 by Vena Austria, MD No   Supervision of other normal pregnancy, antepartum 10/29/2017 by Vena Austria, MD No   Overview Addendum 12/08/2017  8:40 PM by Vena Austria, MD    [facility] Prenatal Labs  Dating LMP = 8 week [place] Blood type: AB positive  Genetic Screen Maternity 21: Normal XY Negative SMA, CF, Fragile-X Antibody:negative  Anatomic [place]  Rubella: Immune Varicella: Immune  GTT HgbA1C: 5.0 Third trimester:  RPR: NR  Rhogam N/A HBsAg: negative  TDaP vaccine [ ]  30 weeks Flu Shot: HIV: negative  Baby Food                                GBS:   Contraception  Pap: 07/03/2016  CBB     CS/VBAC Repeat Cesarean   Support Person Jomarie Longs               Gestational age appropriate obstetric precautions including but not limited to vaginal bleeding, contractions, leaking of fluid and fetal movement were reviewed in detail with the patient.    Return in about 4 weeks (around 01/08/2018) for ROB 4 weeks, ROB and anatomy scan 6 weeks .  Vena Austria, MD, [facility], [facility] 12/11/2017, 4:49 PM

## 2017-12-11 NOTE — Progress Notes (Signed)
ROB Migraines 

## 2017-12-29 ENCOUNTER — Ambulatory Visit: Payer: BLUE CROSS/BLUE SHIELD | Admitting: Obstetrics and Gynecology

## 2018-01-09 ENCOUNTER — Ambulatory Visit (INDEPENDENT_AMBULATORY_CARE_PROVIDER_SITE_OTHER): Payer: BLUE CROSS/BLUE SHIELD | Admitting: Obstetrics and Gynecology

## 2018-01-09 VITALS — BP 106/70 | Wt 200.0 lb

## 2018-01-09 DIAGNOSIS — Z3480 Encounter for supervision of other normal pregnancy, unspecified trimester: Secondary | ICD-10-CM

## 2018-01-09 DIAGNOSIS — Z98891 History of uterine scar from previous surgery: Secondary | ICD-10-CM

## 2018-01-09 DIAGNOSIS — O99212 Obesity complicating pregnancy, second trimester: Secondary | ICD-10-CM

## 2018-01-09 DIAGNOSIS — O99210 Obesity complicating pregnancy, unspecified trimester: Secondary | ICD-10-CM

## 2018-01-09 DIAGNOSIS — O34219 Maternal care for unspecified type scar from previous cesarean delivery: Secondary | ICD-10-CM

## 2018-01-09 DIAGNOSIS — Z3A18 18 weeks gestation of pregnancy: Secondary | ICD-10-CM

## 2018-01-09 LAB — POCT URINALYSIS DIPSTICK OB
Glucose, UA: NEGATIVE
POC,PROTEIN,UA: NEGATIVE

## 2018-01-09 NOTE — Progress Notes (Signed)
    Routine Prenatal Care Visit  Subjective  Amber Graves is a 32 y.o. G2P1001 at [redacted]w[redacted]d being seen today for ongoing prenatal care.  She is currently monitored for the following issues for this low-risk pregnancy and has History of low transverse cesarean section; Maternal obesity, antepartum; and Supervision of other normal pregnancy, antepartum on their problem list.  ----------------------------------------------------------------------------------- Patient reports no complaints.   Contractions: Not present. Vag. Bleeding: None.  Movement: Present. Denies leaking of fluid.  ----------------------------------------------------------------------------------- The following portions of the patient's history were reviewed and updated as appropriate: allergies, current medications, past family history, past medical history, past social history, past surgical history and problem list. Problem list updated.   Objective  Blood pressure 106/70, weight 200 lb (90.7 kg), last menstrual period 09/05/2017, not currently breastfeeding. Pregravid weight 189 lb (85.7 kg) Total Weight Gain 11 lb (4.99 kg) Urinalysis:      Fetal Status: Fetal Heart Rate (bpm): 150   Movement: Present     General:  Alert, oriented and cooperative. Patient is in no acute distress.  Skin: Skin is warm and dry. No rash noted.   Cardiovascular: Normal heart rate noted  Respiratory: Normal respiratory effort, no problems with respiration noted  Abdomen: Soft, gravid, appropriate for gestational age. Pain/Pressure: Absent     Pelvic:  Cervical exam deferred        Extremities: Normal range of motion.     ental Status: Normal mood and affect. Normal behavior. Normal judgment and thought content.   Immunization History  Administered Date(s) Administered  . HPV Quadrivalent 11/10/2007, 02/10/2008, 07/28/2008  . Hepatitis A, Adult 10/15/2013  . PPD Test 10/15/2013, 01/12/2014  . Tdap 10/26/2009     Assessment    31 y.o. G2P1001 at [redacted]w[redacted]d by  06/12/2018, by Last Menstrual Period presenting for routine prenatal visit  Plan   Pregnancy#2 Problems (from 09/05/17 to present)    Problem Noted Resolved   History of low transverse cesarean section 10/29/2017 by Vena Austria, MD No   Maternal obesity, antepartum 10/29/2017 by Vena Austria, MD No   Supervision of other normal pregnancy, antepartum 10/29/2017 by Vena Austria, MD No   Overview Addendum 12/08/2017  8:40 PM by Vena Austria, MD    [facility] Prenatal Labs  Dating LMP = 8 week [place] Blood type: AB positive  Genetic Screen Maternity 21: Normal XY Negative SMA, CF, Fragile-X Antibody:negative  Anatomic [place]  Rubella: Immune Varicella: Immune  GTT HgbA1C: 5.0 Third trimester:  RPR: NR  Rhogam N/A HBsAg: negative  TDaP vaccine [ ]  30 weeks Flu Shot: HIV: negative  Baby Food                                GBS:   Contraception  Pap: 07/03/2016  CBB     CS/VBAC Repeat Cesarean   Support Person Jomarie Longs               Gestational age appropriate obstetric precautions including but not limited to vaginal bleeding, contractions, leaking of fluid and fetal movement were reviewed in detail with the patient.    Return in about 2 weeks (around 01/23/2018) for ROB and anatomy scan 11/7 should be scheduled already.  Vena Austria, MD, Evern Core [facility], [facility] 01/09/2018, 4:05 PM

## 2018-01-09 NOTE — Progress Notes (Signed)
ROB

## 2018-01-22 ENCOUNTER — Ambulatory Visit (INDEPENDENT_AMBULATORY_CARE_PROVIDER_SITE_OTHER): Payer: BLUE CROSS/BLUE SHIELD | Admitting: Obstetrics and Gynecology

## 2018-01-22 ENCOUNTER — Ambulatory Visit (INDEPENDENT_AMBULATORY_CARE_PROVIDER_SITE_OTHER): Payer: BLUE CROSS/BLUE SHIELD

## 2018-01-22 VITALS — BP 112/62 | Wt 202.0 lb

## 2018-01-22 DIAGNOSIS — Z0489 Encounter for examination and observation for other specified reasons: Secondary | ICD-10-CM

## 2018-01-22 DIAGNOSIS — Z98891 History of uterine scar from previous surgery: Secondary | ICD-10-CM

## 2018-01-22 DIAGNOSIS — O34219 Maternal care for unspecified type scar from previous cesarean delivery: Secondary | ICD-10-CM

## 2018-01-22 DIAGNOSIS — Z3A19 19 weeks gestation of pregnancy: Secondary | ICD-10-CM

## 2018-01-22 DIAGNOSIS — Z3480 Encounter for supervision of other normal pregnancy, unspecified trimester: Secondary | ICD-10-CM

## 2018-01-22 DIAGNOSIS — Z363 Encounter for antenatal screening for malformations: Secondary | ICD-10-CM | POA: Diagnosis not present

## 2018-01-22 DIAGNOSIS — O99210 Obesity complicating pregnancy, unspecified trimester: Secondary | ICD-10-CM

## 2018-01-22 DIAGNOSIS — O99212 Obesity complicating pregnancy, second trimester: Secondary | ICD-10-CM

## 2018-01-22 DIAGNOSIS — IMO0002 Reserved for concepts with insufficient information to code with codable children: Secondary | ICD-10-CM

## 2018-01-22 LAB — POCT URINALYSIS DIPSTICK OB
GLUCOSE, UA: NEGATIVE
POC,PROTEIN,UA: NEGATIVE

## 2018-01-22 NOTE — Progress Notes (Signed)
ROB  °Anatomy scan °

## 2018-01-23 NOTE — Progress Notes (Signed)
Routine Prenatal Care Visit  Subjective  Amber Graves is a 32 y.o. G2P1001 at [redacted]w[redacted]d being seen today for ongoing prenatal care.  She is currently monitored for the following issues for this low-risk pregnancy and has History of low transverse cesarean section; Maternal obesity, antepartum; and Supervision of other normal pregnancy, antepartum on their problem list.  ----------------------------------------------------------------------------------- Patient reports no complaints.   Contractions: Not present. Vag. Bleeding: None.  Movement: Present. Denies leaking of fluid.  ----------------------------------------------------------------------------------- The following portions of the patient's history were reviewed and updated as appropriate: allergies, current medications, past family history, past medical history, past social history, past surgical history and problem list. Problem list updated.   Objective  Blood pressure 112/62, weight 202 lb (91.6 kg), last menstrual period 09/05/2017, not currently breastfeeding. Pregravid weight 189 lb (85.7 kg) Total Weight Gain 13 lb (5.897 kg) Urinalysis:      Fetal Status: Fetal Heart Rate (bpm): 147   Movement: Present     General:  Alert, oriented and cooperative. Patient is in no acute distress.  Skin: Skin is warm and dry. No rash noted.   Cardiovascular: Normal heart rate noted  Respiratory: Normal respiratory effort, no problems with respiration noted  Abdomen: Soft, gravid, appropriate for gestational age. Pain/Pressure: Absent     Pelvic:  Cervical exam deferred        Extremities: Normal range of motion.     ental Status: Normal mood and affect. Normal behavior. Normal judgment and thought content.   US Ob Comp + 14 Wk  Result Date: 01/23/2018 ULTRASOUND REPORT Location: [facility] Date of Service: 01/22/2018 [name] DOB: [birth year] MRN: [id number] Indications:Anatomy Ultrasound Findings:  Mason Jim intrauterine pregnancy is visualized with FHR at 147 BPM. Biometrics give an (U/S) Gestational age of [redacted]w[redacted]d and an (U/S) EDD of 06/14/2018; this correlates with the clinically established Estimated Date of Delivery: 06/12/18 Fetal presentation is transverse head maternal right EFW: 290 gram (10 oz). Placenta: anterior. Grade: 0 AFI: subjectively normal. Anatomic survey is incomplete for cardiac views and profile due to fetal position and normal; Gender - female.  Right Ovary is normal in appearance. Left Ovary is normal appearance. Survey of the adnexa demonstrates no adnexal masses. There is no free peritoneal fluid in the cul de sac. Impression: 1. [redacted]w[redacted]d Viable Singleton Intrauterine pregnancy by U/S. 2. (U/S) EDD is consistent with Clinically established Estimated Date of Delivery: 06/12/18 . 3. Normal Anatomy Scan Recommendations: 1.Clinical correlation with the patient's History and Physical Exam. 2. Follow up ultrasound for the completion of anatomy. Mital bahen P Patel, RDMS  There is a singleton gestation with subjectively normal amniotic fluid volume. The fetal biometry correlates with established dating. Detailed evaluation of the fetal anatomy was performed.The fetal anatomical survey appears within normal limits within the resolution of ultrasound as described above.  Cardiac views are incomplete.  It must be noted that a normal ultrasound is unable to rule out fetal aneuploidy.  Vena Austria, MD, FACOG [facility], [facility]     Assessment   31 y.o. G2P1001 at [redacted]w[redacted]d by  06/12/2018, by Last Menstrual Period presenting for routine prenatal visit  Plan   Pregnancy#2 Problems (from 09/05/17 to present)    Problem Noted Resolved   History of low transverse cesarean section 10/29/2017 by Vena Austria, MD No   Maternal obesity, antepartum 10/29/2017 by Vena Austria, MD No   Supervision of other normal pregnancy, antepartum 10/29/2017 by Vena Austria, MD No  Overview Addendum 01/22/2018  4:47 PM by Vena Austria, MD    [facility] Prenatal Labs  Dating LMP = 8 week [place] Blood type: AB positive  Genetic Screen Maternity 21: Normal XY Negative SMA, CF, Fragile-X Antibody:negative  Anatomic [place] Incomplete for heart views at 20 weeks [ ]  24 week follow up Rubella: Immune Varicella: Immune  GTT HgbA1C: 5.0 Third trimester:  RPR: NR  Rhogam N/A HBsAg: negative  TDaP vaccine [ ]  30 weeks Flu Shot: received HIV: negative  Baby Food                                GBS:   Contraception  Pap: 07/03/2016  CBB     CS/VBAC Repeat Cesarean   Support Person Jomarie Longs               Gestational age appropriate obstetric precautions including but not limited to vaginal bleeding, contractions, leaking of fluid and fetal movement were reviewed in detail with the patient.    - anatomy scan incomplete cardiac views  Return in about 4 weeks (around 02/19/2018) for ROB follow up anatomy scan.  Vena Austria, MD, [facility], [facility]

## 2018-02-23 ENCOUNTER — Ambulatory Visit (INDEPENDENT_AMBULATORY_CARE_PROVIDER_SITE_OTHER): Payer: BLUE CROSS/BLUE SHIELD

## 2018-02-23 ENCOUNTER — Ambulatory Visit (INDEPENDENT_AMBULATORY_CARE_PROVIDER_SITE_OTHER): Payer: BLUE CROSS/BLUE SHIELD | Admitting: Obstetrics and Gynecology

## 2018-02-23 VITALS — BP 110/60 | Wt 212.0 lb

## 2018-02-23 DIAGNOSIS — Z3480 Encounter for supervision of other normal pregnancy, unspecified trimester: Secondary | ICD-10-CM

## 2018-02-23 DIAGNOSIS — O99210 Obesity complicating pregnancy, unspecified trimester: Secondary | ICD-10-CM

## 2018-02-23 DIAGNOSIS — Z0489 Encounter for examination and observation for other specified reasons: Secondary | ICD-10-CM

## 2018-02-23 DIAGNOSIS — Z362 Encounter for other antenatal screening follow-up: Secondary | ICD-10-CM | POA: Diagnosis not present

## 2018-02-23 DIAGNOSIS — O99212 Obesity complicating pregnancy, second trimester: Secondary | ICD-10-CM

## 2018-02-23 DIAGNOSIS — Z98891 History of uterine scar from previous surgery: Secondary | ICD-10-CM

## 2018-02-23 DIAGNOSIS — Z3A24 24 weeks gestation of pregnancy: Secondary | ICD-10-CM

## 2018-02-23 DIAGNOSIS — IMO0002 Reserved for concepts with insufficient information to code with codable children: Secondary | ICD-10-CM

## 2018-02-23 LAB — POCT URINALYSIS DIPSTICK OB
Glucose, UA: NEGATIVE
POC,PROTEIN,UA: NEGATIVE

## 2018-02-23 NOTE — Progress Notes (Signed)
Routine Prenatal Care Visit  Subjective  Amber Graves is a 32 y.o. G2P1001 at 3732w3d being seen today for ongoing prenatal care.  She is currently monitored for the following issues for this low-risk pregnancy and has History of low transverse cesarean section; Maternal obesity, antepartum; and Supervision of other normal pregnancy, antepartum on their problem list.  ----------------------------------------------------------------------------------- Patient reports no complaints.   Contractions: Not present. Vag. Bleeding: None.  Movement: Present. Denies leaking of fluid.  ----------------------------------------------------------------------------------- The following portions of the patient's history were reviewed and updated as appropriate: allergies, current medications, past family history, past medical history, past social history, past surgical history and problem list. Problem list updated.   Objective  Blood pressure 110/60, weight 212 lb (96.2 kg), last menstrual period 09/05/2017, not currently breastfeeding. Pregravid weight 189 lb (85.7 kg) Total Weight Gain 23 lb (10.4 kg)  Body mass index is 40.06 kg/m.  Urinalysis:      Fetal Status: Fetal Heart Rate (bpm): 150   Movement: Present     General:  Alert, oriented and cooperative. Patient is in no acute distress.  Skin: Skin is warm and dry. No rash noted.   Cardiovascular: Normal heart rate noted  Respiratory: Normal respiratory effort, no problems with respiration noted  Abdomen: Soft, gravid, appropriate for gestational age. Pain/Pressure: Absent     Pelvic:  Cervical exam deferred        Extremities: Normal range of motion.     ental Status: Normal mood and affect. Normal behavior. Normal judgment and thought content.   [place] Ob Follow Up  Result Date: 02/23/2018 [name] DOB: 07/18/1985 MRN: [id number] ULTRASOUND REPORT Location: [facility] Date of Service: 02/23/2018 Indications:  Anatomy follow up ultrasound Findings: Mason JimSingleton intrauterine pregnancy is visualized with FHR at 146 BPM. Fetal presentation is Breech. Placenta: anterior. Grade: 0 AFI: subjectively normal. Anatomic survey is complete for 4 chamber/outflow tracts and profile. There is no free peritoneal fluid in the cul de sac. Impression: 1. 10932w3d Viable Singleton Intrauterine pregnancy previously established criteria. 2. Normal Anatomy Scan is now complete Recommendations: 1.Clinical correlation with the patient's History and Physical Exam. Darlina GuysAbby M Clarke, RDMS RVT There is a singleton gestation with subjectively normal amniotic fluid volume.  Limited evaluation of the fetal anatomy was performed today, focusing on on anatomic structures not fully visualized at the time of prior study.The visualized fetal anatomical survey appears within normal limits within the resolution of ultrasound as described above, and the anatomic survey is now complete.  It must be noted that a normal ultrasound is unable to rule out fetal aneuploidy.  Vena AustriaAndreas Jaycee Pelzer, MD, Evern CoreFACOG [facility], [facility] 02/23/2018, 4:16 PM     Assessment   32 y.o. G2P1001 at 5032w3d by  06/12/2018, by Last Menstrual Period presenting for routine prenatal visit  Plan   Pregnancy#2 Problems (from 09/05/17 to present)    Problem Noted Resolved   History of low transverse cesarean section 10/29/2017 by Vena AustriaStaebler, Cashus Halterman, MD No   Maternal obesity, antepartum 10/29/2017 by Vena AustriaStaebler, Camielle Sizer, MD No   Supervision of other normal pregnancy, antepartum 10/29/2017 by Vena AustriaStaebler, Deran Barro, MD No   Overview Addendum 01/22/2018  4:47 PM by Vena AustriaStaebler, Cohl Behrens, MD    [facility] Prenatal Labs  Dating LMP = 8 week US Blood type: AB positive  Genetic Screen Maternity 21: Normal XY Negative SMA, CF, Fragile-X Antibody:negative  Anatomic US Incomplete for heart views at 20 weeks [ ]  24 week follow up Rubella: Immune  Varicella: Immune  GTT HgbA1C: 5.0 Third  trimester:  RPR: NR  Rhogam N/A HBsAg: negative  TDaP vaccine [ ]  30 weeks Flu Shot: received HIV: negative  Baby Food                                GBS:   Contraception  Pap: 07/03/2016  CBB     CS/VBAC Repeat Cesarean   Support Person Jomarie Longs               Gestational age appropriate obstetric precautions including but not limited to vaginal bleeding, contractions, leaking of fluid and fetal movement were reviewed in detail with the patient.   - normal follow up anatomy scan - 28 week labs next visit - mild GERD symptoms - moving into new house this month  Return in about 4 weeks (around 03/23/2018) for ROB and 28 week labs.  Vena Austria, MD, [facility], [facility] 02/23/2018, 4:17 PM

## 2018-02-23 NOTE — Progress Notes (Signed)
ROB Follow up anatomy scan today 

## 2018-03-23 ENCOUNTER — Ambulatory Visit (INDEPENDENT_AMBULATORY_CARE_PROVIDER_SITE_OTHER): Payer: BLUE CROSS/BLUE SHIELD | Admitting: Obstetrics and Gynecology

## 2018-03-23 ENCOUNTER — Other Ambulatory Visit: Payer: BLUE CROSS/BLUE SHIELD

## 2018-03-23 VITALS — BP 110/66 | Wt 219.0 lb

## 2018-03-23 DIAGNOSIS — O34219 Maternal care for unspecified type scar from previous cesarean delivery: Secondary | ICD-10-CM

## 2018-03-23 DIAGNOSIS — O99213 Obesity complicating pregnancy, third trimester: Secondary | ICD-10-CM

## 2018-03-23 DIAGNOSIS — Z98891 History of uterine scar from previous surgery: Secondary | ICD-10-CM

## 2018-03-23 DIAGNOSIS — O99210 Obesity complicating pregnancy, unspecified trimester: Secondary | ICD-10-CM

## 2018-03-23 DIAGNOSIS — Z3A28 28 weeks gestation of pregnancy: Secondary | ICD-10-CM

## 2018-03-23 DIAGNOSIS — Z3480 Encounter for supervision of other normal pregnancy, unspecified trimester: Secondary | ICD-10-CM

## 2018-03-23 LAB — POCT URINALYSIS DIPSTICK OB
Glucose, UA: NEGATIVE
PROTEIN: NEGATIVE

## 2018-03-23 NOTE — Progress Notes (Signed)
    Routine Prenatal Care Visit  Subjective  Amber Graves is a 33 y.o. G2P1001 at [redacted]w[redacted]d being seen today for ongoing prenatal care.  She is currently monitored for the following issues for this high-risk pregnancy and has History of low transverse cesarean section; Maternal obesity, antepartum; and Supervision of other normal pregnancy, antepartum on their problem list.  ----------------------------------------------------------------------------------- Patient reports no complaints.   Contractions: Not present. Vag. Bleeding: None.  Movement: Present. Denies leaking of fluid.  ----------------------------------------------------------------------------------- The following portions of the patient's history were reviewed and updated as appropriate: allergies, current medications, past family history, past medical history, past social history, past surgical history and problem list. Problem list updated.   Objective  Blood pressure 110/66, weight 219 lb (99.3 kg), last menstrual period 09/05/2017, not currently breastfeeding. Pregravid weight 189 lb (85.7 kg) Total Weight Gain 30 lb (13.6 kg)  Body mass index is 41.38 kg/m.  Urinalysis:      Fetal Status: Fetal Heart Rate (bpm): 145 Fundal Height: 28 cm Movement: Present     General:  Alert, oriented and cooperative. Patient is in no acute distress.  Skin: Skin is warm and dry. No rash noted.   Cardiovascular: Normal heart rate noted  Respiratory: Normal respiratory effort, no problems with respiration noted  Abdomen: Soft, gravid, appropriate for gestational age. Pain/Pressure: Absent     Pelvic:  Cervical exam deferred        Extremities: Normal range of motion.     ental Status: Normal mood and affect. Normal behavior. Normal judgment and thought content.     Assessment   33 y.o. G2P1001 at [redacted]w[redacted]d by  06/12/2018, by Last Menstrual Period presenting for routine prenatal visit  Plan   Pregnancy#2 Problems (from 09/05/17  to present)    Problem Noted Resolved   History of low transverse cesarean section 10/29/2017 by Vena Austria, MD No   Maternal obesity, antepartum 10/29/2017 by Vena Austria, MD No   Supervision of other normal pregnancy, antepartum 10/29/2017 by Vena Austria, MD No   Overview Addendum 01/22/2018  4:47 PM by Vena Austria, MD    [facility] Prenatal Labs  Dating LMP = 8 week [place] Blood type: AB positive  Genetic Screen Maternity 21: Normal XY Negative SMA, CF, Fragile-X Antibody:negative  Anatomic [place] Incomplete for heart views at 20 weeks [ ]  24 week follow up Rubella: Immune Varicella: Immune  GTT HgbA1C: 5.0 Third trimester:  RPR: NR  Rhogam N/A HBsAg: negative  TDaP vaccine [ ]  30 weeks Flu Shot: received HIV: negative  Baby Food                                GBS:   Contraception  Pap: 07/03/2016  CBB     CS/VBAC Repeat Cesarean   Support Person Jomarie Longs               Gestational age appropriate obstetric precautions including but not limited to vaginal bleeding, contractions, leaking of fluid and fetal movement were reviewed in detail with the patient.    Return in about 2 weeks (around 04/06/2018) for ROB and growth scan.  Vena Austria, MD, Evern Core [facility], [facility] 03/23/2018, 3:12 PM

## 2018-03-23 NOTE — Progress Notes (Signed)
ROB 28 week labs 

## 2018-03-24 LAB — 28 WEEK RH+PANEL
Basophils Absolute: 0.1 10*3/uL (ref 0.0–0.2)
Basos: 1 %
EOS (ABSOLUTE): 0.1 10*3/uL (ref 0.0–0.4)
EOS: 1 %
GESTATIONAL DIABETES SCREEN: 91 mg/dL (ref 65–139)
HEMATOCRIT: 35.8 % (ref 34.0–46.6)
HEMOGLOBIN: 12.5 g/dL (ref 11.1–15.9)
HIV SCREEN 4TH GENERATION: NONREACTIVE
Immature Grans (Abs): 0.2 10*3/uL — ABNORMAL HIGH (ref 0.0–0.1)
Immature Granulocytes: 2 %
LYMPHS ABS: 2.1 10*3/uL (ref 0.7–3.1)
Lymphs: 20 %
MCH: 32 pg (ref 26.6–33.0)
MCHC: 34.9 g/dL (ref 31.5–35.7)
MCV: 92 fL (ref 79–97)
MONOCYTES: 8 %
Monocytes Absolute: 0.9 10*3/uL (ref 0.1–0.9)
NEUTROS ABS: 7.4 10*3/uL — AB (ref 1.4–7.0)
Neutrophils: 68 %
Platelets: 187 10*3/uL (ref 150–450)
RBC: 3.91 x10E6/uL (ref 3.77–5.28)
RDW: 12.7 % (ref 11.7–15.4)
RPR Ser Ql: NONREACTIVE
WBC: 10.7 10*3/uL (ref 3.4–10.8)

## 2018-04-06 ENCOUNTER — Ambulatory Visit (INDEPENDENT_AMBULATORY_CARE_PROVIDER_SITE_OTHER): Payer: BLUE CROSS/BLUE SHIELD

## 2018-04-06 ENCOUNTER — Ambulatory Visit (INDEPENDENT_AMBULATORY_CARE_PROVIDER_SITE_OTHER): Payer: BLUE CROSS/BLUE SHIELD | Admitting: Obstetrics & Gynecology

## 2018-04-06 VITALS — BP 120/70 | Wt 220.0 lb

## 2018-04-06 DIAGNOSIS — O99213 Obesity complicating pregnancy, third trimester: Secondary | ICD-10-CM

## 2018-04-06 DIAGNOSIS — Z98891 History of uterine scar from previous surgery: Secondary | ICD-10-CM

## 2018-04-06 DIAGNOSIS — Z362 Encounter for other antenatal screening follow-up: Secondary | ICD-10-CM

## 2018-04-06 DIAGNOSIS — O34219 Maternal care for unspecified type scar from previous cesarean delivery: Secondary | ICD-10-CM

## 2018-04-06 DIAGNOSIS — Z3480 Encounter for supervision of other normal pregnancy, unspecified trimester: Secondary | ICD-10-CM

## 2018-04-06 DIAGNOSIS — O99210 Obesity complicating pregnancy, unspecified trimester: Secondary | ICD-10-CM

## 2018-04-06 DIAGNOSIS — Z23 Encounter for immunization: Secondary | ICD-10-CM

## 2018-04-06 DIAGNOSIS — Z3A30 30 weeks gestation of pregnancy: Secondary | ICD-10-CM

## 2018-04-06 LAB — POCT URINALYSIS DIPSTICK OB
Glucose, UA: NEGATIVE
PROTEIN: NEGATIVE

## 2018-04-06 MED ORDER — TETANUS-DIPHTH-ACELL PERTUSSIS 5-2.5-18.5 LF-MCG/0.5 IM SUSP
0.5000 mL | Freq: Once | INTRAMUSCULAR | Status: AC
  Administered 2018-04-06: 0.5 mL via INTRAMUSCULAR

## 2018-04-06 NOTE — Addendum Note (Signed)
Addended by: Cornelius Moras D on: 04/06/2018 04:28 PM   Modules accepted: Orders

## 2018-04-06 NOTE — Progress Notes (Signed)
  Subjective  Fetal Movement? yes Contractions? no Leaking Fluid? no Vaginal Bleeding? no  Objective  BP 120/70   Wt 220 lb (99.8 kg)   LMP 09/05/2017 (Exact Date)   BMI 41.57 kg/m  General: NAD Pumonary: no increased work of breathing Abdomen: gravid, non-tender Extremities: no edema Psychiatric: mood appropriate, affect full  Assessment  33 y.o. G2P1001 at [redacted]w[redacted]d by  06/12/2018, by Last Menstrual Period presenting for routine prenatal visit  Plan   Problem List Items Addressed This Visit      Other   History of low transverse cesarean section   Maternal obesity, antepartum   Supervision of other normal pregnancy, antepartum    Other Visit Diagnoses    [redacted] weeks gestation of pregnancy    -  Primary    Review of ULTRASOUND.    I have personally reviewed images and report of recent ultrasound done at [facility].    Plan of management to be discussed with patient. Normal growth  Plans CS, dates discussed (3/20, 24, or 26) No BTL PNV, [facility]  Annamarie Major, MD, [facility], [facility] 04/06/2018  4:24 PM

## 2018-04-06 NOTE — Patient Instructions (Signed)
Third Trimester of Pregnancy The third trimester is from week 28 through week 40 (months 7 through 9). The third trimester is a time when the unborn baby (fetus) is growing rapidly. At the end of the ninth month, the fetus is about 20 inches in length and weighs 6-10 pounds. Body changes during your third trimester Your body will continue to go through many changes during pregnancy. The changes vary from woman to woman. During the third trimester:  Your weight will continue to increase. You can expect to gain 25-35 pounds (11-16 kg) by the end of the pregnancy.  You may begin to get stretch marks on your hips, abdomen, and breasts.  You may urinate more often because the fetus is moving lower into your pelvis and pressing on your bladder.  You may develop or continue to have heartburn. This is caused by increased hormones that slow down muscles in the digestive tract.  You may develop or continue to have constipation because increased hormones slow digestion and cause the muscles that push waste through your intestines to relax.  You may develop hemorrhoids. These are swollen veins (varicose veins) in the rectum that can itch or be painful.  You may develop swollen, bulging veins (varicose veins) in your legs.  You may have increased body aches in the pelvis, back, or thighs. This is due to weight gain and increased hormones that are relaxing your joints.  You may have changes in your hair. These can include thickening of your hair, rapid growth, and changes in texture. Some women also have hair loss during or after pregnancy, or hair that feels dry or thin. Your hair will most likely return to normal after your baby is born.  Your breasts will continue to grow and they will continue to become tender. A yellow fluid (colostrum) may leak from your breasts. This is the first milk you are producing for your baby.  Your belly button may stick out.  You may notice more swelling in your hands,  face, or ankles.  You may have increased tingling or numbness in your hands, arms, and legs. The skin on your belly may also feel numb.  You may feel short of breath because of your expanding uterus.  You may have more problems sleeping. This can be caused by the size of your belly, increased need to urinate, and an increase in your body's metabolism.  You may notice the fetus "dropping," or moving lower in your abdomen (lightening).  You may have increased vaginal discharge.  You may notice your joints feel loose and you may have pain around your pelvic bone. What to expect at prenatal visits You will have prenatal exams every 2 weeks until week 36. Then you will have weekly prenatal exams. During a routine prenatal visit:  You will be weighed to make sure you and the baby are growing normally.  Your blood pressure will be taken.  Your abdomen will be measured to track your baby's growth.  The fetal heartbeat will be listened to.  Any test results from the previous visit will be discussed.  You may have a cervical check near your due date to see if your cervix has softened or thinned (effaced).  You will be tested for Group B streptococcus. This happens between 35 and 37 weeks. Your health care provider may ask you:  What your birth plan is.  How you are feeling.  If you are feeling the baby move.  If you have had any abnormal   symptoms, such as leaking fluid, bleeding, severe headaches, or abdominal cramping.  If you are using any tobacco products, including cigarettes, chewing tobacco, and electronic cigarettes.  If you have any questions. Other tests or screenings that may be performed during your third trimester include:  Blood tests that check for low iron levels (anemia).  Fetal testing to check the health, activity level, and growth of the fetus. Testing is done if you have certain medical conditions or if there are problems during the pregnancy.  Nonstress test  (NST). This test checks the health of your baby to make sure there are no signs of problems, such as the baby not getting enough oxygen. During this test, a belt is placed around your belly. The baby is made to move, and its heart rate is monitored during movement. What is false labor? False labor is a condition in which you feel small, irregular tightenings of the muscles in the womb (contractions) that usually go away with rest, changing position, or drinking water. These are called Braxton Hicks contractions. Contractions may last for hours, days, or even weeks before true labor sets in. If contractions come at regular intervals, become more frequent, increase in intensity, or become painful, you should see your health care provider. What are the signs of labor?  Abdominal cramps.  Regular contractions that start at 10 minutes apart and become stronger and more frequent with time.  Contractions that start on the top of the uterus and spread down to the lower abdomen and back.  Increased pelvic pressure and dull back pain.  A watery or bloody mucus discharge that comes from the vagina.  Leaking of amniotic fluid. This is also known as your "water breaking." It could be a slow trickle or a gush. Let your health care provider know if it has a color or strange odor. If you have any of these signs, call your health care provider right away, even if it is before your due date. Follow these instructions at home: Medicines  Follow your health care provider's instructions regarding medicine use. Specific medicines may be either safe or unsafe to take during pregnancy.  Take a prenatal vitamin that contains at least 600 micrograms (mcg) of folic acid.  If you develop constipation, try taking a stool softener if your health care provider approves. Eating and drinking   Eat a balanced diet that includes fresh fruits and vegetables, whole grains, good sources of protein such as meat, eggs, or tofu,  and low-fat dairy. Your health care provider will help you determine the amount of weight gain that is right for you.  Avoid raw meat and uncooked cheese. These carry germs that can cause birth defects in the baby.  If you have low calcium intake from food, talk to your health care provider about whether you should take a daily calcium supplement.  Eat four or five small meals rather than three large meals a day.  Limit foods that are high in fat and processed sugars, such as fried and sweet foods.  To prevent constipation: ? Drink enough fluid to keep your urine clear or pale yellow. ? Eat foods that are high in fiber, such as fresh fruits and vegetables, whole grains, and beans. Activity  Exercise only as directed by your health care provider. Most women can continue their usual exercise routine during pregnancy. Try to exercise for 30 minutes at least 5 days a week. Stop exercising if you experience uterine contractions.  Avoid heavy lifting.  Do   not exercise in extreme heat or humidity, or at high altitudes.  Wear low-heel, comfortable shoes.  Practice good posture.  You may continue to have sex unless your health care provider tells you otherwise. Relieving pain and discomfort  Take frequent breaks and rest with your legs elevated if you have leg cramps or low back pain.  Take warm sitz baths to soothe any pain or discomfort caused by hemorrhoids. Use hemorrhoid cream if your health care provider approves.  Wear a good support bra to prevent discomfort from breast tenderness.  If you develop varicose veins: ? Wear support pantyhose or compression stockings as told by your healthcare provider. ? Elevate your feet for 15 minutes, 3-4 times a day. Prenatal care  Write down your questions. Take them to your prenatal visits.  Keep all your prenatal visits as told by your health care provider. This is important. Safety  Wear your seat belt at all times when driving.  Make  a list of emergency phone numbers, including numbers for family, friends, the hospital, and police and fire departments. General instructions  Avoid cat litter boxes and soil used by cats. These carry germs that can cause birth defects in the baby. If you have a cat, ask someone to clean the litter box for you.  Do not travel far distances unless it is absolutely necessary and only with the approval of your health care provider.  Do not use hot tubs, steam rooms, or saunas.  Do not drink alcohol.  Do not use any products that contain nicotine or tobacco, such as cigarettes and e-cigarettes. If you need help quitting, ask your health care provider.  Do not use any medicinal herbs or unprescribed drugs. These chemicals affect the formation and growth of the baby.  Do not douche or use tampons or scented sanitary pads.  Do not cross your legs for long periods of time.  To prepare for the arrival of your baby: ? Take prenatal classes to understand, practice, and ask questions about labor and delivery. ? Make a trial run to the hospital. ? Visit the hospital and tour the maternity area. ? Arrange for maternity or paternity leave through employers. ? Arrange for family and friends to take care of pets while you are in the hospital. ? Purchase a rear-facing car seat and make sure you know how to install it in your car. ? Pack your hospital bag. ? Prepare the baby's nursery. Make sure to remove all pillows and stuffed animals from the baby's crib to prevent suffocation.  Visit your dentist if you have not gone during your pregnancy. Use a soft toothbrush to brush your teeth and be gentle when you floss. Contact a health care provider if:  You are unsure if you are in labor or if your water has broken.  You become dizzy.  You have mild pelvic cramps, pelvic pressure, or nagging pain in your abdominal area.  You have lower back pain.  You have persistent nausea, vomiting, or  diarrhea.  You have an unusual or bad smelling vaginal discharge.  You have pain when you urinate. Get help right away if:  Your water breaks before 37 weeks.  You have regular contractions less than 5 minutes apart before 37 weeks.  You have a fever.  You are leaking fluid from your vagina.  You have spotting or bleeding from your vagina.  You have severe abdominal pain or cramping.  You have rapid weight loss or weight gain.  You have   shortness of breath with chest pain.  You notice sudden or extreme swelling of your face, hands, ankles, feet, or legs.  Your baby makes fewer than 10 movements in 2 hours.  You have severe headaches that do not go away when you take medicine.  You have vision changes. Summary  The third trimester is from week 28 through week 40, months 7 through 9. The third trimester is a time when the unborn baby (fetus) is growing rapidly.  During the third trimester, your discomfort may increase as you and your baby continue to gain weight. You may have abdominal, leg, and back pain, sleeping problems, and an increased need to urinate.  During the third trimester your breasts will keep growing and they will continue to become tender. A yellow fluid (colostrum) may leak from your breasts. This is the first milk you are producing for your baby.  False labor is a condition in which you feel small, irregular tightenings of the muscles in the womb (contractions) that eventually go away. These are called Braxton Hicks contractions. Contractions may last for hours, days, or even weeks before true labor sets in.  Signs of labor can include: abdominal cramps; regular contractions that start at 10 minutes apart and become stronger and more frequent with time; watery or bloody mucus discharge that comes from the vagina; increased pelvic pressure and dull back pain; and leaking of amniotic fluid. This information is not intended to replace advice given to you by your  health care provider. Make sure you discuss any questions you have with your health care provider. Document Released: 02/26/2001 Document Revised: 04/09/2016 Document Reviewed: 04/09/2016 Elsevier Interactive Patient Education  2019 [employer]vier Inc[employer]  

## 2018-04-07 ENCOUNTER — Telehealth: Payer: Self-pay | Admitting: Obstetrics & Gynecology

## 2018-04-07 ENCOUNTER — Other Ambulatory Visit: Payer: Self-pay | Admitting: Obstetrics & Gynecology

## 2018-04-07 NOTE — Telephone Encounter (Signed)
Let her know we have scheduled her for this date, 3/20 w Dr Tiburcio Pea (time to be determined)

## 2018-04-07 NOTE — Telephone Encounter (Signed)
Patient states she was given a few dates to choose from for C-section by [facility].  She would like to do March 20 if she could.

## 2018-04-07 NOTE — Telephone Encounter (Signed)
Pt aware.

## 2018-04-08 ENCOUNTER — Telehealth: Payer: Self-pay | Admitting: Obstetrics & Gynecology

## 2018-04-08 NOTE — Telephone Encounter (Signed)
-----   Message from Nadara Mustard, MD sent at 04/07/2018  1:34 PM EST ----- Regarding: SURGERY Surgery Booking Request Patient Full Name:  Amber Graves  MRN: [id number]  DOB: 02/12/1986  Surgeon: [name], MD  Requested Surgery Date and Time: 06/05/2018, can be anytime but prefer 745 or 1200 (I Graves off this day and will plan around proposed timing of surgery) Primary Diagnosis AND Code: Prior CS, Term Pregnancy Secondary Diagnosis and Code:  Surgical Procedure: Cesarean Section L&D Notification: Yes Admission Status: surgery admit Length of Surgery: 1 hr Special Case Needs: no H&P: yes (date) Phone Interview???: no Interpreter: Language:  Medical Clearance: no Special Scheduling Instructions: no

## 2018-04-08 NOTE — Telephone Encounter (Signed)
Patient is aware of H&P at [facility] on 06/03/18 @ 3:30pm w/ Dr Tiburcio Pea, Pre-admit Testing to be scheduled for 06/04/18 (8:00am requested), and OR on 06/05/18.

## 2018-04-20 ENCOUNTER — Encounter: Payer: Self-pay | Admitting: Obstetrics and Gynecology

## 2018-04-20 ENCOUNTER — Ambulatory Visit (INDEPENDENT_AMBULATORY_CARE_PROVIDER_SITE_OTHER): Payer: BLUE CROSS/BLUE SHIELD | Admitting: Obstetrics and Gynecology

## 2018-04-20 VITALS — BP 104/68 | Wt 224.0 lb

## 2018-04-20 DIAGNOSIS — O99210 Obesity complicating pregnancy, unspecified trimester: Secondary | ICD-10-CM

## 2018-04-20 DIAGNOSIS — O34219 Maternal care for unspecified type scar from previous cesarean delivery: Secondary | ICD-10-CM

## 2018-04-20 DIAGNOSIS — Z98891 History of uterine scar from previous surgery: Secondary | ICD-10-CM

## 2018-04-20 DIAGNOSIS — Z3A32 32 weeks gestation of pregnancy: Secondary | ICD-10-CM

## 2018-04-20 DIAGNOSIS — O99213 Obesity complicating pregnancy, third trimester: Secondary | ICD-10-CM

## 2018-04-20 DIAGNOSIS — Z3480 Encounter for supervision of other normal pregnancy, unspecified trimester: Secondary | ICD-10-CM

## 2018-04-20 NOTE — Progress Notes (Signed)
    Routine Prenatal Care Visit  Subjective  Amber Graves is a 33 y.o. G2P1001 at [redacted]w[redacted]d being seen today for ongoing prenatal care.  She is currently monitored for the following issues for this low-risk pregnancy and has History of low transverse cesarean section; Maternal obesity, antepartum; and Supervision of other normal pregnancy, antepartum on their problem list.  ----------------------------------------------------------------------------------- Patient reports no complaints.   Contractions: Not present. Vag. Bleeding: None.  Movement: Present. Denies leaking of fluid.  ----------------------------------------------------------------------------------- The following portions of the patient's history were reviewed and updated as appropriate: allergies, current medications, past family history, past medical history, past social history, past surgical history and problem list. Problem list updated.   Objective  Blood pressure 104/68, weight 224 lb (101.6 kg), last menstrual period 09/05/2017, not currently breastfeeding. Pregravid weight 189 lb (85.7 kg) Total Weight Gain 35 lb (15.9 kg) Urinalysis:      Fetal Status: Fetal Heart Rate (bpm): 140 Fundal Height: 37 cm Movement: Present     General:  Alert, oriented and cooperative. Patient is in no acute distress.  Skin: Skin is warm and dry. No rash noted.   Cardiovascular: Normal heart rate noted  Respiratory: Normal respiratory effort, no problems with respiration noted  Abdomen: Soft, gravid, appropriate for gestational age. Pain/Pressure: Present     Pelvic:  Cervical exam deferred        Extremities: Normal range of motion.     Mental Status: Normal mood and affect. Normal behavior. Normal judgment and thought content.     Assessment   33 y.o. G2P1001 at [redacted]w[redacted]d by  06/12/2018, by Last Menstrual Period presenting for routine prenatal visit  Plan   Pregnancy#2 Problems (from 09/05/17 to present)    Problem Noted  Resolved   History of low transverse cesarean section 10/29/2017 by Vena Austria, MD No   Maternal obesity, antepartum 10/29/2017 by Vena Austria, MD No   Supervision of other normal pregnancy, antepartum 10/29/2017 by Vena Austria, MD No   Overview Addendum 04/20/2018  4:27 PM by Natale Milch, MD    [facility] Prenatal Labs  Dating LMP = 8 week [place] Blood type: AB positive  Genetic Screen Maternity 21: Normal XY Negative SMA, CF, Fragile-X Antibody:negative  Anatomic [place] Incomplete for heart views at 20 weeks [X]  24 week follow up complete Rubella: Immune Varicella: Immune  GTT HgbA1C: 5.0 Third trimester: 91  RPR: NR  Rhogam N/A HBsAg: negative  TDaP vaccine 04/06/2018  Flu Shot: received HIV: negative  Baby Food  Breast                              GBS:   Contraception  Nexplanon Pap: 07/03/2016  CBB     CS/VBAC Repeat Cesarean   Support Person Jomarie Longs               Gestational age appropriate obstetric precautions including but not limited to vaginal bleeding, contractions, leaking of fluid and fetal movement were reviewed in detail with the patient.    Discussed Nexplanon, given brochure   Return in about 2 weeks (around 05/04/2018) for ROB and [place].  Natale Milch MD [facility], [facility] 04/20/2018, 4:27 PM

## 2018-04-20 NOTE — Progress Notes (Signed)
ROB C/o swelling in hands and feet, pelvic pressure Good FM, denies lof

## 2018-05-04 ENCOUNTER — Ambulatory Visit (INDEPENDENT_AMBULATORY_CARE_PROVIDER_SITE_OTHER): Payer: BLUE CROSS/BLUE SHIELD

## 2018-05-04 ENCOUNTER — Ambulatory Visit (INDEPENDENT_AMBULATORY_CARE_PROVIDER_SITE_OTHER): Payer: BLUE CROSS/BLUE SHIELD | Admitting: Obstetrics & Gynecology

## 2018-05-04 VITALS — BP 102/70 | Wt 225.0 lb

## 2018-05-04 DIAGNOSIS — O99210 Obesity complicating pregnancy, unspecified trimester: Secondary | ICD-10-CM

## 2018-05-04 DIAGNOSIS — Z3A34 34 weeks gestation of pregnancy: Secondary | ICD-10-CM

## 2018-05-04 DIAGNOSIS — Z3480 Encounter for supervision of other normal pregnancy, unspecified trimester: Secondary | ICD-10-CM

## 2018-05-04 DIAGNOSIS — Z98891 History of uterine scar from previous surgery: Secondary | ICD-10-CM

## 2018-05-04 DIAGNOSIS — O99213 Obesity complicating pregnancy, third trimester: Secondary | ICD-10-CM | POA: Diagnosis not present

## 2018-05-04 DIAGNOSIS — O34219 Maternal care for unspecified type scar from previous cesarean delivery: Secondary | ICD-10-CM

## 2018-05-04 NOTE — Progress Notes (Signed)
  Subjective  Fetal Movement? yes Contractions? BHs occas Leaking Fluid? no Vaginal Bleeding? no  Objective  BP 102/70   Wt 225 lb (102.1 kg)   LMP 09/05/2017 (Exact Date)   BMI 42.51 kg/m  General: NAD Pumonary: no increased work of breathing Abdomen: gravid, non-tender Extremities: no edema Psychiatric: mood appropriate, affect full  Assessment  33 y.o. G2P1001 at [redacted]w[redacted]d by  06/12/2018, by Last Menstrual Period presenting for routine prenatal visit  Plan   Problem List Items Addressed This Visit      Other   History of low transverse cesarean section   Maternal obesity, antepartum   Supervision of other normal pregnancy, antepartum    Other Visit Diagnoses    [redacted] weeks gestation of pregnancy    -  Primary    Review of ULTRASOUND.    I have personally reviewed images and report of recent ultrasound done at [facility].    Plan of management to be discussed with patient.    EFW 47%, good AFI, Vtx  [facility] Prenatal Labs  Dating LMP = 8 week [place] Blood type: AB positive  Genetic Screen Maternity 21: Normal XY Negative SMA, CF, Fragile-X Antibody:negative  Anatomic [place] Incomplete for heart views at 20 weeks [X]  24 week follow up complete Rubella: Immune Varicella: Immune  GTT HgbA1C: 5.0 Third trimester: 91  RPR: NR  Rhogam N/A HBsAg: negative  TDaP vaccine 04/06/2018  Flu Shot: received HIV: negative  Baby Food  Breast                              GBS: pending  Contraception  Nexplanon Pap: 07/03/2016, repeat PP  CBB  No   CS/VBAC Repeat Cesarean 06/05/2018      Annamarie Major, MD, [facility], [facility] 05/04/2018  4:24 PM

## 2018-05-21 ENCOUNTER — Ambulatory Visit (INDEPENDENT_AMBULATORY_CARE_PROVIDER_SITE_OTHER): Payer: BLUE CROSS/BLUE SHIELD | Admitting: Obstetrics & Gynecology

## 2018-05-21 VITALS — BP 110/80 | Wt 225.0 lb

## 2018-05-21 DIAGNOSIS — Z3685 Encounter for antenatal screening for Streptococcus B: Secondary | ICD-10-CM

## 2018-05-21 DIAGNOSIS — Z3480 Encounter for supervision of other normal pregnancy, unspecified trimester: Secondary | ICD-10-CM

## 2018-05-21 DIAGNOSIS — Z3A36 36 weeks gestation of pregnancy: Secondary | ICD-10-CM

## 2018-05-21 DIAGNOSIS — Z98891 History of uterine scar from previous surgery: Secondary | ICD-10-CM

## 2018-05-21 DIAGNOSIS — O34219 Maternal care for unspecified type scar from previous cesarean delivery: Secondary | ICD-10-CM

## 2018-05-21 NOTE — Patient Instructions (Signed)
Group B Streptococcus Infection During Pregnancy  Group B Streptococcus (GBS) is a type of bacteria (Streptococcus agalactiae) that is often found in healthy people, commonly in the rectum, vagina, and intestines. In people who are healthy and not pregnant, the bacteria rarely cause serious illness or complications. However, women who test positive for GBS during pregnancy can pass the bacteria to their baby during childbirth, which can cause serious infection in the baby after birth. Women with GBS may also have infections during their pregnancy or immediately after childbirth, such as such as urinary tract infections (UTIs) or infections of the uterus (uterine infections). Having GBS also increases a woman's risk of complications during pregnancy, such as early (preterm) labor or delivery, miscarriage, or stillbirth. Routine testing (screening) for GBS is recommended for all pregnant women. What increases the risk? You may have a higher risk for GBS infection during pregnancy if you had one during a past pregnancy. What are the signs or symptoms? In most cases, GBS infection does not cause symptoms in pregnant women. Signs and symptoms of a possible GBS-related infection may include:  Labor starting before the 37th week of pregnancy.  A UTI or bladder infection, which may cause: ? Fever. ? Pain or burning during urination. ? Frequent urination.  Fever during labor, along with: ? Bad-smelling discharge. ? Uterine tenderness. ? Rapid heartbeat in the mother, baby, or both. Rare but serious symptoms of a possible GBS-related infection in women include:  Blood infection (septicemia). This may cause fever, chills, or confusion.  Lung infection (pneumonia). This may cause fever, chills, cough, rapid breathing, difficulty breathing, or chest pain.  Bone, joint, skin, or soft tissue infection. How is this diagnosed? You may be screened for GBS between week 35 and week 37 of your pregnancy. If  you have symptoms of preterm labor, you may be screened earlier. This condition is diagnosed based on lab test results from:  A swab of fluid from the vagina and rectum.  A urine sample. How is this treated? This condition is treated with antibiotic medicine. When you go into labor, or as soon as your water breaks (your membranes rupture), you will be given antibiotics through an IV tube. Antibiotics will continue until after you give birth. If you are having a cesarean delivery, you do not need antibiotics unless your membranes have already ruptured. Follow these instructions at home:  Take over-the-counter and prescription medicines only as told by your health care provider.  Take your antibiotic medicine as told by your health care provider. Do not stop taking the antibiotic even if you start to feel better.  Keep all pre-birth (prenatal) visits and follow-up visits as told by your health care provider. This is important. Contact a health care provider if:  You have pain or burning when you urinate.  You have to urinate frequently.  You have a fever or chills.  You develop a bad-smelling vaginal discharge. Get help right away if:  Your membranes rupture.  You go into labor.  You have severe pain in your abdomen.  You have difficulty breathing.  You have chest pain. This information is not intended to replace advice given to you by your health care provider. Make sure you discuss any questions you have with your health care provider. Document Released: 06/11/2007 Document Revised: 09/29/2015 Document Reviewed: 09/28/2015 Elsevier Interactive Patient Education  2019 Elsevier Inc.  

## 2018-05-21 NOTE — Progress Notes (Signed)
  Subjective  Fetal Movement? yes Contractions? no Leaking Fluid? no Vaginal Bleeding? no  Objective  BP 110/80   Wt 225 lb (102.1 kg)   LMP 09/05/2017 (Exact Date)   BMI 42.51 kg/m  General: NAD Pumonary: no increased work of breathing Abdomen: gravid, non-tender Extremities: no edema Psychiatric: mood appropriate, affect full SVE: 0/20/-3 Assessment  32 y.o. G2P1001 at [redacted]w[redacted]d by  06/12/2018, by Last Menstrual Period presenting for routine prenatal visit  Plan   Problem List Items Addressed This Visit      Other   History of low transverse cesarean section   Supervision of other normal pregnancy, antepartum    Other Visit Diagnoses    Screening, antenatal, for Streptococcus B    -  Primary   Relevant Orders   Culture, beta strep (group b only)   [redacted] weeks gestation of pregnancy        CS discussed (3/20 am) Nexplanon  Breast  Annamarie Major, MD, [facility], [facility] 05/21/2018  4:14 PM

## 2018-05-21 NOTE — Progress Notes (Signed)
ROB GBS 

## 2018-05-25 LAB — CULTURE, BETA STREP (GROUP B ONLY): STREP GP B CULTURE: NEGATIVE

## 2018-05-29 ENCOUNTER — Ambulatory Visit (INDEPENDENT_AMBULATORY_CARE_PROVIDER_SITE_OTHER): Payer: BLUE CROSS/BLUE SHIELD | Admitting: Obstetrics & Gynecology

## 2018-05-29 ENCOUNTER — Other Ambulatory Visit: Payer: Self-pay

## 2018-05-29 VITALS — BP 120/70 | Wt 229.0 lb

## 2018-05-29 DIAGNOSIS — Z3A38 38 weeks gestation of pregnancy: Secondary | ICD-10-CM

## 2018-05-29 DIAGNOSIS — Z3480 Encounter for supervision of other normal pregnancy, unspecified trimester: Secondary | ICD-10-CM

## 2018-05-29 DIAGNOSIS — Z98891 History of uterine scar from previous surgery: Secondary | ICD-10-CM

## 2018-05-29 DIAGNOSIS — O34219 Maternal care for unspecified type scar from previous cesarean delivery: Secondary | ICD-10-CM

## 2018-05-29 LAB — POCT URINALYSIS DIPSTICK OB
Glucose, UA: NEGATIVE
POC,PROTEIN,UA: NEGATIVE

## 2018-05-29 NOTE — Progress Notes (Signed)
  Subjective  Fetal Movement? yes Contractions? no Leaking Fluid? no Vaginal Bleeding? no  Objective  BP 120/70   Wt 229 lb (103.9 kg)   LMP 09/05/2017 (Exact Date)   BMI 43.27 kg/m  General: NAD Pumonary: no increased work of breathing Abdomen: gravid, non-tender Extremities: no edema Psychiatric: mood appropriate, affect full  Assessment  33 y.o. G2P1001 at [redacted]w[redacted]d by  06/12/2018, by Last Menstrual Period presenting for routine prenatal visit  Plan   Problem List Items Addressed This Visit      Other   History of low transverse cesarean section   Supervision of other normal pregnancy, antepartum    Other Visit Diagnoses    [redacted] weeks gestation of pregnancy    -  Primary    Repeat CS next Friday Labor precautions discussed Breast feeding plans Nexplanon  Annamarie Major, MD, [facility], [facility] 05/29/2018  4:15 PM

## 2018-05-29 NOTE — Addendum Note (Signed)
Addended by: Cornelius Moras D on: 05/29/2018 04:43 PM   Modules accepted: Orders

## 2018-06-03 ENCOUNTER — Other Ambulatory Visit: Payer: Self-pay

## 2018-06-03 ENCOUNTER — Ambulatory Visit (INDEPENDENT_AMBULATORY_CARE_PROVIDER_SITE_OTHER): Payer: BLUE CROSS/BLUE SHIELD | Admitting: Obstetrics & Gynecology

## 2018-06-03 ENCOUNTER — Encounter: Payer: Self-pay | Admitting: Obstetrics & Gynecology

## 2018-06-03 VITALS — BP 100/60 | Ht 61.0 in | Wt 228.0 lb

## 2018-06-03 DIAGNOSIS — O34219 Maternal care for unspecified type scar from previous cesarean delivery: Secondary | ICD-10-CM

## 2018-06-03 DIAGNOSIS — Z3A38 38 weeks gestation of pregnancy: Secondary | ICD-10-CM

## 2018-06-03 DIAGNOSIS — Z98891 History of uterine scar from previous surgery: Secondary | ICD-10-CM

## 2018-06-03 NOTE — H&P (View-Only) (Signed)
    PRE-OPERATIVE HISTORY AND PHYSICAL EXAM  HPI:  Amber Graves is a 33 y.o. G2P1001.  Patient's last menstrual period was 09/05/2017 (exact date).  [redacted]w[redacted]d Estimated Date of Delivery: 06/12/18  She is being admitted for Elective repeat  PMHx: She  has a past medical history of Abnormal Pap smear of cervix, Migraines, Oligomenorrhea, and STD (sexually transmitted disease). Also,  has a past surgical history that includes Colposcopy (06-05-2012) and Cesarean section (N/A, 05/23/2016)., family history includes Diabetes in her paternal aunt, paternal grandfather, paternal grandmother, and paternal uncle; Heart disease in her maternal grandfather; Stroke in her paternal grandmother; Thyroid disease in her maternal grandmother and mother; Tongue cancer in her mother.,  reports that she has never smoked. She has never used smokeless tobacco. She reports current alcohol use. She reports that she does not use drugs. OB History  Gravida Para Term Preterm AB Living  2 1 1 0 0 1  SAB TAB Ectopic Multiple Live Births  0 0 0 0 1    # Outcome Date GA Lbr Len/2nd Weight Sex Delivery Anes PTL Lv  2 Current           1 Term 05/23/16 [redacted]w[redacted]d  7 lb 12 oz (3.515 kg) M CS-LTranv  N LIV  Patient denies any other pertinent gynecologic issues. See prenatal record for more complete H&P   Current Outpatient Medications:  .  Prenatal Vit-Fe Fumarate-FA (MULTIVITAMIN-PRENATAL) 27-0.8 MG TABS tablet, Take 1 tablet by mouth daily at 12 noon. , Disp: , Rfl:  Also, has No Known Allergies.  Review of Systems  Constitutional: Negative for chills, fever and malaise/fatigue.  HENT: Negative for congestion, sinus pain and sore throat.   Eyes: Negative for blurred vision and pain.  Respiratory: Negative for cough and wheezing.   Cardiovascular: Negative for chest pain and leg swelling.  Gastrointestinal: Negative for abdominal pain, constipation, diarrhea, heartburn, nausea and vomiting.  Genitourinary: Negative for  dysuria, frequency, hematuria and urgency.  Musculoskeletal: Negative for back pain, joint pain, myalgias and neck pain.  Skin: Negative for itching and rash.  Neurological: Negative for dizziness, tremors and weakness.  Endo/Heme/Allergies: Does not bruise/bleed easily.  Psychiatric/Behavioral: Negative for depression. The patient is not nervous/anxious and does not have insomnia.     Objective: BP 100/60   Ht 5' 1" (1.549 m)   Wt 228 lb (103.4 kg)   LMP 09/05/2017 (Exact Date)   BMI 43.08 kg/m  Filed Weights   06/03/18 1523  Weight: 228 lb (103.4 kg)   Physical Exam Constitutional:      General: She is not in acute distress.    Appearance: She is well-developed.  HENT:     Head: Normocephalic and atraumatic. No laceration.     Right Ear: Hearing normal.     Left Ear: Hearing normal.     Mouth/Throat:     Pharynx: Uvula midline.  Eyes:     Pupils: Pupils are equal, round, and reactive to light.  Neck:     Musculoskeletal: Normal range of motion and neck supple.     Thyroid: No thyromegaly.  Cardiovascular:     Rate and Rhythm: Normal rate and regular rhythm.     Heart sounds: No murmur. No friction rub. No gallop.   Pulmonary:     Effort: Pulmonary effort is normal. No respiratory distress.     Breath sounds: Normal breath sounds. No wheezing.  Chest:     Breasts:        Right:   No mass, skin change or tenderness.        Left: No mass, skin change or tenderness.  Abdominal:     General: Bowel sounds are normal. There is no distension.     Palpations: Abdomen is soft.     Tenderness: There is no abdominal tenderness. There is no rebound.  Musculoskeletal: Normal range of motion.  Neurological:     Mental Status: She is alert and oriented to person, place, and time.     Cranial Nerves: No cranial nerve deficit.  Skin:    General: Skin is warm and dry.  Psychiatric:        Judgment: Judgment normal.  Vitals signs reviewed.     Assessment: 1. [redacted] weeks  gestation of pregnancy   2. History of low transverse cesarean section    PLAN: 1.  Cesarean Delivery as Scheduled.  Patient will undergo surgical management with Cesarean Section.   The risks of surgery were discussed in detail with the patient including but not limited to: bleeding which may require transfusion or reoperation; infection which may require antibiotics; injury to surrounding organs which may involve bowel, bladder, ureters ; need for additional procedures including laparoscopy or laparotomy; thromboembolic phenomenon, surgical site problems and other postoperative/anesthesia complications. Likelihood of success in alleviating the patient's condition was discussed. Routine postoperative instructions will be reviewed with the patient and her family in detail after surgery.  The patient concurred with the proposed plan, giving informed written consent for the surgery.  Patient will be NPO procedure.  Preoperative prophylactic antibiotics, as necessary, and SCDs ordered on call to the OR.  [facility] Prenatal Labs  Dating LMP = 8 week [place] Blood type: AB positive  Genetic Screen Maternity 21: Normal XY Negative SMA, CF, Fragile-X Antibody:negative  Anatomic [place] Incomplete for heart views at 20 weeks [X]  24 week follow up complete Rubella: Immune Varicella: Immune  GTT HgbA1C: 5.0 Third trimester: 91  RPR: NR  Rhogam N/A HBsAg: negative  TDaP vaccine 04/06/2018 Flu Shot: received HIV: negative  Baby Food Breast  GBS: pending  Contraception Nexplanon Pap: 07/03/2016, repeat PP  CBB  No   CS/VBAC Repeat Cesarean 06/05/2018     Annamarie Major, M.D. 06/03/2018 3:27 PM

## 2018-06-03 NOTE — Progress Notes (Signed)
PRE-OPERATIVE HISTORY AND PHYSICAL EXAM  HPI:  Amber Graves is a 33 y.o. G2P1001.  Patient's last menstrual period was 09/05/2017 (exact date).  [redacted]w[redacted]d Estimated Date of Delivery: 06/12/18  She is being admitted for Elective repeat  PMHx: She  has a past medical history of Abnormal Pap smear of cervix, Migraines, Oligomenorrhea, and STD (sexually transmitted disease). Also,  has a past surgical history that includes Colposcopy (06-05-2012) and Cesarean section (N/A, 05/23/2016)., family history includes Diabetes in her paternal aunt, paternal grandfather, paternal grandmother, and paternal uncle; Heart disease in her maternal grandfather; Stroke in her paternal grandmother; Thyroid disease in her maternal grandmother and mother; Tongue cancer in her mother.,  reports that she has never smoked. She has never used smokeless tobacco. She reports current alcohol use. She reports that she does not use drugs. OB History  Gravida Para Term Preterm AB Living  2 1 1  0 0 1  SAB TAB Ectopic Multiple Live Births  0 0 0 0 1    # Outcome Date GA Lbr Len/2nd Weight Sex Delivery Anes PTL Lv  2 Current           1 Term 05/23/16 [redacted]w[redacted]d  7 lb 12 oz (3.515 kg) M CS-LTranv  N LIV  Patient denies any other pertinent gynecologic issues. See prenatal record for more complete H&P   Current Outpatient Medications:  .  Prenatal Vit-Fe Fumarate-FA (MULTIVITAMIN-PRENATAL) 27-0.8 MG TABS tablet, Take 1 tablet by mouth daily at 12 noon. , Disp: , Rfl:  Also, has No Known Allergies.  Review of Systems  Constitutional: Negative for chills, fever and malaise/fatigue.  HENT: Negative for congestion, sinus pain and sore throat.   Eyes: Negative for blurred vision and pain.  Respiratory: Negative for cough and wheezing.   Cardiovascular: Negative for chest pain and leg swelling.  Gastrointestinal: Negative for abdominal pain, constipation, diarrhea, heartburn, nausea and vomiting.  Genitourinary: Negative for  dysuria, frequency, hematuria and urgency.  Musculoskeletal: Negative for back pain, joint pain, myalgias and neck pain.  Skin: Negative for itching and rash.  Neurological: Negative for dizziness, tremors and weakness.  Endo/Heme/Allergies: Does not bruise/bleed easily.  Psychiatric/Behavioral: Negative for depression. The patient is not nervous/anxious and does not have insomnia.     Objective: BP 100/60   Ht 5\' 1"  (1.549 m)   Wt 228 lb (103.4 kg)   LMP 09/05/2017 (Exact Date)   BMI 43.08 kg/m  Filed Weights   06/03/18 1523  Weight: 228 lb (103.4 kg)   Physical Exam Constitutional:      General: She is not in acute distress.    Appearance: She is well-developed.  HENT:     Head: Normocephalic and atraumatic. No laceration.     Right Ear: Hearing normal.     Left Ear: Hearing normal.     Mouth/Throat:     Pharynx: Uvula midline.  Eyes:     Pupils: Pupils are equal, round, and reactive to light.  Neck:     Musculoskeletal: Normal range of motion and neck supple.     Thyroid: No thyromegaly.  Cardiovascular:     Rate and Rhythm: Normal rate and regular rhythm.     Heart sounds: No murmur. No friction rub. No gallop.   Pulmonary:     Effort: Pulmonary effort is normal. No respiratory distress.     Breath sounds: Normal breath sounds. No wheezing.  Chest:     Breasts:        Right:  No mass, skin change or tenderness.        Left: No mass, skin change or tenderness.  Abdominal:     General: Bowel sounds are normal. There is no distension.     Palpations: Abdomen is soft.     Tenderness: There is no abdominal tenderness. There is no rebound.  Musculoskeletal: Normal range of motion.  Neurological:     Mental Status: She is alert and oriented to person, place, and time.     Cranial Nerves: No cranial nerve deficit.  Skin:    General: Skin is warm and dry.  Psychiatric:        Judgment: Judgment normal.  Vitals signs reviewed.     Assessment: 1. [redacted] weeks  gestation of pregnancy   2. History of low transverse cesarean section    PLAN: 1.  Cesarean Delivery as Scheduled.  Patient will undergo surgical management with Cesarean Section.   The risks of surgery were discussed in detail with the patient including but not limited to: bleeding which may require transfusion or reoperation; infection which may require antibiotics; injury to surrounding organs which may involve bowel, bladder, ureters ; need for additional procedures including laparoscopy or laparotomy; thromboembolic phenomenon, surgical site problems and other postoperative/anesthesia complications. Likelihood of success in alleviating the patient's condition was discussed. Routine postoperative instructions will be reviewed with the patient and her family in detail after surgery.  The patient concurred with the proposed plan, giving informed written consent for the surgery.  Patient will be NPO procedure.  Preoperative prophylactic antibiotics, as necessary, and SCDs ordered on call to the OR.  [facility] Prenatal Labs  Dating LMP = 8 week [place] Blood type: AB positive  Genetic Screen Maternity 21: Normal XY Negative SMA, CF, Fragile-X Antibody:negative  Anatomic [place] Incomplete for heart views at 20 weeks [X]  24 week follow up complete Rubella: Immune Varicella: Immune  GTT HgbA1C: 5.0 Third trimester: 91  RPR: NR  Rhogam N/A HBsAg: negative  TDaP vaccine 04/06/2018 Flu Shot: received HIV: negative  Baby Food Breast  GBS: pending  Contraception Nexplanon Pap: 07/03/2016, repeat PP  CBB  No   CS/VBAC Repeat Cesarean 06/05/2018     Annamarie Major, M.D. 06/03/2018 3:27 PM

## 2018-06-03 NOTE — Patient Instructions (Signed)

## 2018-06-04 ENCOUNTER — Encounter
Admission: RE | Admit: 2018-06-04 | Discharge: 2018-06-04 | Disposition: A | Discharge status: Home / Self Care | Payer: BLUE CROSS/BLUE SHIELD | Source: Ambulatory Visit | Attending: Obstetrics & Gynecology | Admitting: Obstetrics & Gynecology

## 2018-06-04 ENCOUNTER — Other Ambulatory Visit: Payer: Self-pay

## 2018-06-04 DIAGNOSIS — Z01812 Encounter for preprocedural laboratory examination: Secondary | ICD-10-CM | POA: Insufficient documentation

## 2018-06-04 LAB — CBC
HCT: 39.5 % (ref 36.0–46.0)
HEMOGLOBIN: 13.7 g/dL (ref 12.0–15.0)
MCH: 32.2 pg (ref 26.0–34.0)
MCHC: 34.7 g/dL (ref 30.0–36.0)
MCV: 92.9 fL (ref 80.0–100.0)
NRBC: 0 % (ref 0.0–0.2)
Platelets: 146 10*3/uL — ABNORMAL LOW (ref 150–400)
RBC: 4.25 MIL/uL (ref 3.87–5.11)
RDW: 13.2 % (ref 11.5–15.5)
WBC: 8.7 10*3/uL (ref 4.0–10.5)

## 2018-06-04 LAB — TYPE AND SCREEN
ABO/RH(D): AB POS
Antibody Screen: NEGATIVE
Extend sample reason: UNDETERMINED

## 2018-06-04 MED ORDER — SODIUM CHLORIDE 0.9 % IV SOLN: 2.0000 g | INTRAVENOUS | Status: DC

## 2018-06-05 ENCOUNTER — Inpatient Hospital Stay
Admission: RE | Admit: 2018-06-05 | Payer: BLUE CROSS/BLUE SHIELD | Source: Home / Self Care | Admitting: Obstetrics & Gynecology

## 2018-06-05 ENCOUNTER — Inpatient Hospital Stay: Payer: BLUE CROSS/BLUE SHIELD | Admitting: Anesthesiology

## 2018-06-05 ENCOUNTER — Encounter
Admission: RE | Disposition: A | Discharge status: Home / Self Care | Payer: Self-pay | Source: Home / Self Care | Attending: Obstetrics & Gynecology

## 2018-06-05 ENCOUNTER — Inpatient Hospital Stay
Admission: RE | Admit: 2018-06-05 | Discharge: 2018-06-07 | DRG: 788 | Disposition: A | Discharge status: Home / Self Care | Payer: BLUE CROSS/BLUE SHIELD | Attending: Obstetrics & Gynecology | Admitting: Obstetrics & Gynecology

## 2018-06-05 DIAGNOSIS — Z3480 Encounter for supervision of other normal pregnancy, unspecified trimester: Secondary | ICD-10-CM

## 2018-06-05 DIAGNOSIS — O99210 Obesity complicating pregnancy, unspecified trimester: Secondary | ICD-10-CM

## 2018-06-05 DIAGNOSIS — Z98891 History of uterine scar from previous surgery: Secondary | ICD-10-CM

## 2018-06-05 DIAGNOSIS — Z3A38 38 weeks gestation of pregnancy: Secondary | ICD-10-CM

## 2018-06-05 DIAGNOSIS — O34211 Maternal care for low transverse scar from previous cesarean delivery: Principal | ICD-10-CM | POA: Diagnosis present

## 2018-06-05 DIAGNOSIS — Z3A39 39 weeks gestation of pregnancy: Secondary | ICD-10-CM

## 2018-06-05 SURGERY — Surgical Case
Anesthesia: Spinal

## 2018-06-05 MED ORDER — BUPIVACAINE IN DEXTROSE 0.75-8.25 % IT SOLN
INTRATHECAL | Status: DC | PRN
  Administered 2018-06-05: 1.5 mL via INTRATHECAL

## 2018-06-05 MED ORDER — KETOROLAC TROMETHAMINE 30 MG/ML IJ SOLN: 30.0000 mg | Freq: Four times a day (QID) | INTRAMUSCULAR | Status: AC

## 2018-06-05 MED ORDER — SIMETHICONE 80 MG PO CHEW
80.0000 mg | CHEWABLE_TABLET | Freq: Three times a day (TID) | ORAL | Status: DC
  Administered 2018-06-05 – 2018-06-07 (×6): 80 mg via ORAL
  Filled 2018-06-05 (×6): qty 1

## 2018-06-05 MED ORDER — ZOLPIDEM TARTRATE 5 MG PO TABS: 5.0000 mg | ORAL_TABLET | Freq: Every evening | ORAL | Status: DC | PRN

## 2018-06-05 MED ORDER — BUPIVACAINE ON-Q PAIN PUMP (FOR ORDER SET NO CHG)
INJECTION | Status: DC
  Filled 2018-06-05: qty 1

## 2018-06-05 MED ORDER — KETOROLAC TROMETHAMINE 30 MG/ML IJ SOLN
30.0000 mg | Freq: Four times a day (QID) | INTRAMUSCULAR | Status: AC
  Administered 2018-06-05 – 2018-06-06 (×4): 30 mg via INTRAVENOUS
  Filled 2018-06-05 (×4): qty 1

## 2018-06-05 MED ORDER — OXYCODONE HCL 5 MG PO TABS: 5.0000 mg | ORAL_TABLET | Freq: Once | ORAL | Status: DC | PRN

## 2018-06-05 MED ORDER — BUPIVACAINE 0.25 % ON-Q PUMP DUAL CATH 400 ML
400.0000 mL | INJECTION | Status: DC
  Filled 2018-06-05: qty 400

## 2018-06-05 MED ORDER — DIBUCAINE 1 % RE OINT: 1.0000 "application " | TOPICAL_OINTMENT | RECTAL | Status: DC | PRN

## 2018-06-05 MED ORDER — PHENYLEPHRINE HCL 10 MG/ML IJ SOLN
INTRAMUSCULAR | Status: DC | PRN
  Administered 2018-06-05 (×3): 100 ug via INTRAVENOUS

## 2018-06-05 MED ORDER — OXYCODONE-ACETAMINOPHEN 5-325 MG PO TABS: 1.0000 | ORAL_TABLET | ORAL | Status: DC | PRN

## 2018-06-05 MED ORDER — DIPHENHYDRAMINE HCL 25 MG PO CAPS: 25.0000 mg | ORAL_CAPSULE | Freq: Four times a day (QID) | ORAL | Status: DC | PRN

## 2018-06-05 MED ORDER — BUPIVACAINE HCL 0.25 % IJ SOLN
INTRAMUSCULAR | Status: DC | PRN
  Administered 2018-06-05: 10 mL

## 2018-06-05 MED ORDER — PHENYLEPHRINE 40 MCG/ML (10ML) SYRINGE FOR IV PUSH (FOR BLOOD PRESSURE SUPPORT)
PREFILLED_SYRINGE | INTRAVENOUS | Status: DC | PRN
  Administered 2018-06-05: 40 ug via INTRAVENOUS
  Administered 2018-06-05: 30 ug via INTRAVENOUS

## 2018-06-05 MED ORDER — OXYCODONE HCL 5 MG PO TABS: 5.0000 mg | ORAL_TABLET | Freq: Four times a day (QID) | ORAL | Status: DC | PRN

## 2018-06-05 MED ORDER — DIPHENHYDRAMINE HCL 50 MG/ML IJ SOLN: 12.5000 mg | INTRAMUSCULAR | Status: DC | PRN

## 2018-06-05 MED ORDER — FENTANYL CITRATE (PF) 100 MCG/2ML IJ SOLN: 25.0000 ug | INTRAMUSCULAR | Status: DC | PRN

## 2018-06-05 MED ORDER — MEPERIDINE HCL 25 MG/ML IJ SOLN: 6.2500 mg | INTRAMUSCULAR | Status: DC | PRN

## 2018-06-05 MED ORDER — NALBUPHINE HCL 10 MG/ML IJ SOLN: 5.0000 mg | Freq: Once | INTRAMUSCULAR | Status: DC | PRN

## 2018-06-05 MED ORDER — NALOXONE HCL 0.4 MG/ML IJ SOLN: 0.4000 mg | INTRAMUSCULAR | Status: DC | PRN

## 2018-06-05 MED ORDER — MORPHINE SULFATE (PF) 2 MG/ML IV SOLN: 1.0000 mg | INTRAVENOUS | Status: DC | PRN

## 2018-06-05 MED ORDER — BUPIVACAINE HCL 0.5 % IJ SOLN
10.0000 mL | Freq: Once | INTRAMUSCULAR | Status: DC
  Filled 2018-06-05: qty 10

## 2018-06-05 MED ORDER — OXYCODONE HCL 5 MG/5ML PO SOLN
5.0000 mg | Freq: Once | ORAL | Status: DC | PRN
  Filled 2018-06-05: qty 5

## 2018-06-05 MED ORDER — SIMETHICONE 80 MG PO CHEW: 80.0000 mg | CHEWABLE_TABLET | ORAL | Status: DC | PRN

## 2018-06-05 MED ORDER — SOD CITRATE-CITRIC ACID 500-334 MG/5ML PO SOLN
30.0000 mL | ORAL | Status: AC
  Administered 2018-06-05: 30 mL via ORAL
  Filled 2018-06-05: qty 15

## 2018-06-05 MED ORDER — SIMETHICONE 80 MG PO CHEW: 80.0000 mg | CHEWABLE_TABLET | ORAL | Status: DC

## 2018-06-05 MED ORDER — DIPHENHYDRAMINE HCL 25 MG PO CAPS: 25.0000 mg | ORAL_CAPSULE | ORAL | Status: DC | PRN

## 2018-06-05 MED ORDER — FENTANYL CITRATE (PF) 100 MCG/2ML IJ SOLN
INTRAMUSCULAR | Status: DC | PRN
  Administered 2018-06-05: 15 ug via INTRAVENOUS

## 2018-06-05 MED ORDER — SENNOSIDES-DOCUSATE SODIUM 8.6-50 MG PO TABS
2.0000 | ORAL_TABLET | ORAL | Status: DC
  Administered 2018-06-06: 2 via ORAL
  Filled 2018-06-05: qty 2

## 2018-06-05 MED ORDER — MENTHOL 3 MG MT LOZG: 1.0000 | LOZENGE | OROMUCOSAL | Status: DC | PRN

## 2018-06-05 MED ORDER — LACTATED RINGERS IV SOLN: INTRAVENOUS | Status: DC

## 2018-06-05 MED ORDER — COCONUT OIL OIL
1.0000 "application " | TOPICAL_OIL | Status: DC | PRN
  Filled 2018-06-05: qty 120

## 2018-06-05 MED ORDER — MORPHINE SULFATE (PF) 0.5 MG/ML IJ SOLN
INTRAMUSCULAR | Status: DC | PRN
  Administered 2018-06-05: .1 mg via EPIDURAL

## 2018-06-05 MED ORDER — PRENATAL MULTIVITAMIN CH
1.0000 | ORAL_TABLET | Freq: Every day | ORAL | Status: DC
  Administered 2018-06-05 – 2018-06-06 (×2): 1 via ORAL
  Filled 2018-06-05 (×2): qty 1

## 2018-06-05 MED ORDER — KETOROLAC TROMETHAMINE 30 MG/ML IJ SOLN: 30.0000 mg | Freq: Once | INTRAMUSCULAR | Status: AC

## 2018-06-05 MED ORDER — NALBUPHINE HCL 10 MG/ML IJ SOLN: 5.0000 mg | INTRAMUSCULAR | Status: DC | PRN

## 2018-06-05 MED ORDER — SODIUM CHLORIDE 0.9% FLUSH: 3.0000 mL | INTRAVENOUS | Status: DC | PRN

## 2018-06-05 MED ORDER — SODIUM CHLORIDE 0.9 % IV SOLN
2.0000 g | Freq: Once | INTRAVENOUS | Status: AC
  Administered 2018-06-05: 2 g via INTRAVENOUS
  Filled 2018-06-05: qty 2

## 2018-06-05 MED ORDER — LACTATED RINGERS IV SOLN
INTRAVENOUS | Status: DC
  Administered 2018-06-05 (×2): via INTRAVENOUS

## 2018-06-05 MED ORDER — ONDANSETRON HCL 4 MG/2ML IJ SOLN: 4.0000 mg | Freq: Three times a day (TID) | INTRAMUSCULAR | Status: DC | PRN

## 2018-06-05 MED ORDER — OXYTOCIN 40 UNITS IN NORMAL SALINE INFUSION - SIMPLE MED
2.5000 [IU]/h | INTRAVENOUS | Status: AC
  Administered 2018-06-05: 2.5 [IU]/h via INTRAVENOUS
  Filled 2018-06-05: qty 1000

## 2018-06-05 MED ORDER — OXYTOCIN 10 UNIT/ML IJ SOLN
INTRAMUSCULAR | Status: DC | PRN
  Administered 2018-06-05: 40 [IU] via INTRAMUSCULAR

## 2018-06-05 MED ORDER — ACETAMINOPHEN 325 MG PO TABS
650.0000 mg | ORAL_TABLET | ORAL | Status: DC | PRN
  Administered 2018-06-06 – 2018-06-07 (×4): 650 mg via ORAL
  Filled 2018-06-05 (×4): qty 2

## 2018-06-05 MED ORDER — WITCH HAZEL-GLYCERIN EX PADS: 1.0000 "application " | MEDICATED_PAD | CUTANEOUS | Status: DC | PRN

## 2018-06-05 MED ORDER — ACETAMINOPHEN 325 MG PO TABS
650.0000 mg | ORAL_TABLET | Freq: Four times a day (QID) | ORAL | Status: AC
  Administered 2018-06-05 – 2018-06-06 (×4): 650 mg via ORAL
  Filled 2018-06-05 (×4): qty 2

## 2018-06-05 SURGICAL SUPPLY — 25 items
CANISTER SUCT 3000ML PPV (MISCELLANEOUS) ×3 IMPLANT
CATH KIT ON-Q SILVERSOAK 5 (CATHETERS) ×2 IMPLANT
CATH KIT ON-Q SILVERSOAK 5IN (CATHETERS) ×6 IMPLANT
CHLORAPREP W/TINT 26 (MISCELLANEOUS) ×6 IMPLANT
COVER WAND RF STERILE (DRAPES) ×3 IMPLANT
DERMABOND ADVANCED (GAUZE/BANDAGES/DRESSINGS) ×2
DERMABOND ADVANCED .7 DNX12 (GAUZE/BANDAGES/DRESSINGS) ×1 IMPLANT
DRSG OPSITE POSTOP 4X10 (GAUZE/BANDAGES/DRESSINGS) ×2 IMPLANT
ELECT CAUTERY BLADE 6.4 (BLADE) ×3 IMPLANT
ELECT REM PT RETURN 9FT ADLT (ELECTROSURGICAL) ×3
ELECTRODE REM PT RTRN 9FT ADLT (ELECTROSURGICAL) ×1 IMPLANT
GLOVE SKINSENSE NS SZ8.0 LF (GLOVE) ×2
GLOVE SKINSENSE STRL SZ8.0 LF (GLOVE) ×1 IMPLANT
GOWN STRL REUS W/ TWL LRG LVL3 (GOWN DISPOSABLE) ×1 IMPLANT
GOWN STRL REUS W/ TWL XL LVL3 (GOWN DISPOSABLE) ×2 IMPLANT
GOWN STRL REUS W/TWL LRG LVL3 (GOWN DISPOSABLE) ×2
GOWN STRL REUS W/TWL XL LVL3 (GOWN DISPOSABLE) ×4
NS IRRIG 1000ML POUR BTL (IV SOLUTION) ×3 IMPLANT
PACK C SECTION AR (MISCELLANEOUS) ×3 IMPLANT
PAD OB MATERNITY 4.3X12.25 (PERSONAL CARE ITEMS) ×3 IMPLANT
PAD PREP 24X41 OB/GYN DISP (PERSONAL CARE ITEMS) ×3 IMPLANT
SUT MAXON ABS #0 GS21 30IN (SUTURE) ×6 IMPLANT
SUT VIC AB 1 CT1 36 (SUTURE) ×7 IMPLANT
SUT VIC AB 2-0 CT1 36 (SUTURE) ×3 IMPLANT
SUT VIC AB 4-0 FS2 27 (SUTURE) ×3 IMPLANT

## 2018-06-05 NOTE — Interval H&P Note (Signed)
History and Physical Interval Note:  06/05/2018 7:06 AM  Amber Graves  has presented today for surgery, with the diagnosis of PRIOR CSECTION, TERM PREGNANCY.  The various methods of treatment have been discussed with the patient and family. After consideration of risks, benefits and other options for treatment, the patient has consented to  Procedure(s): CESAREAN SECTION (N/A) as a surgical intervention.  The patient's history has been reviewed, patient examined, no change in status, stable for surgery.  I have reviewed the patient's chart and labs.  Questions were answered to the patient's satisfaction.     [name]

## 2018-06-05 NOTE — Anesthesia Post-op Follow-up Note (Signed)
Anesthesia QCDR form completed.        

## 2018-06-05 NOTE — Discharge Instructions (Signed)

## 2018-06-05 NOTE — Op Note (Signed)
Cesarean Section Procedure Note Indications: prior cesarean section and term intrauterine pregnancy  Pre-operative Diagnosis: Intrauterine pregnancy [redacted]w[redacted]d ;  prior cesarean section and term intrauterine pregnancy Post-operative Diagnosis: same, delivered. Procedure: Low Transverse Cesarean Section Surgeon: Annamarie Major, MD, FACOG Assistant(s): Farrel Conners, CNM, No other capable assistant available, in surgery requiring high level assistant. Anesthesia: Spinal anesthesia Estimated Blood Loss:640 mL QBL Complications: None; patient tolerated the procedure well. Disposition: PACU - hemodynamically stable. Condition: stable  Findings: A female infant in the cephalic presentation.  "Jomarie Longs" Amniotic fluid - Clear  Birth weight 7-3 lbs.  Apgars of 8 and 9.  Intact placenta with a three-vessel cord. Grossly normal uterus, tubes and ovaries bilaterally. Minimal intraabdominal adhesions were noted.  Procedure Details   The patient was taken to Operating Room, identified as the correct patient and the procedure verified as C-Section Delivery. A Time Out was held and the above information confirmed. After induction of anesthesia, the patient was draped and prepped in the usual sterile manner. A Pfannenstiel incision was made and carried down through the subcutaneous tissue to the fascia. Fascial incision was made and extended transversely with the Mayo scissors. The fascia was separated from the underlying rectus tissue superiorly and inferiorly. The peritoneum was identified and entered bluntly. Peritoneal incision was extended longitudinally. The utero-vesical peritoneal reflection was incised transversely and a bladder flap was created digitally.  A low transverse hysterotomy was made. The fetus was delivered atraumatically. The umbilical cord was clamped x2 and cut and the infant was handed to the awaiting pediatricians. The placenta was removed intact and appeared normal with a 3-vessel cord.   The uterus was exteriorized and cleared of all clot and debris. The hysterotomy was closed with running sutures of 0 Vicryl suture. A second imbricating layer was placed with the same suture. Excellent hemostasis was observed. The uterus was returned to the abdomen. The pelvis was irrigated and again, excellent hemostasis was noted.  The On Q Pain pump System was then placed.  Trocars were placed through the abdominal wall into the subfascial space and these were used to thread the silver soaker cathaters into place.The rectus fascia was then reapproximated with running sutures of Maxon, with careful placement not to incorporate the cathaters. Subcutaneous tissues are then irrigated with saline and hemostasis assured.  Skin is then closed with 4-0 vicryl suture in a subcuticular fashion followed by skin adhesive. The cathaters are flushed each with 5 mL of Bupivicaine and stabilized into place with dressing. Instrument, sponge, and needle counts were correct prior to the abdominal closure and at the conclusion of the case.  The patient tolerated the procedure well and was transferred to the recovery room in stable condition.   Annamarie Major, MD, [facility], [facility] 06/05/2018  8:35 AM

## 2018-06-05 NOTE — Anesthesia Procedure Notes (Signed)
Spinal  Patient location during procedure: OR Start time: 06/05/2018 7:30 AM End time: 06/05/2018 7:33 AM Staffing Anesthesiologist: Piscitello, Precious Haws, MD Performed: anesthesiologist  Preanesthetic Checklist Completed: patient identified, site marked, surgical consent, pre-op evaluation, timeout performed, IV checked, risks and benefits discussed and monitors and equipment checked Spinal Block Patient position: sitting Prep: ChloraPrep Patient monitoring: heart rate, continuous pulse ox, blood pressure and cardiac monitor Approach: midline Location: L3-4 Injection technique: single-shot Needle Needle type: Whitacre and Introducer  Needle gauge: 24 G Needle length: 9 cm Assessment Sensory level: T10 Additional Notes Negative paresthesia. Negative blood return. Positive free-flowing CSF. Expiration date of kit checked and confirmed. Patient tolerated procedure well, without complications.

## 2018-06-05 NOTE — Discharge Summary (Signed)
OB Discharge Summary     [name] DOB: [birth year] MRN: [id number]  Date of admission: 06/05/2018 Delivering MD: [name], MD  Date of Delivery: 06/05/2018  Date of discharge: 06/07/2018  Admitting diagnosis: 39 weeks, History of Cesarean Section Intrauterine pregnancy: [redacted]w[redacted]d     Secondary diagnosis: None     Discharge diagnosis: Term Pregnancy Delivered, Reasons for cesarean section  Elective repeat                         Hospital course:  Sceduled C/S   33 y.o. yo G2P2002 at [redacted]w[redacted]d was admitted to the hospital 06/05/2018 for scheduled cesarean section with the following indication:Elective Repeat.  Membrane Rupture Time/Date: 7:48 AM ,06/05/2018   Patient delivered a Viable infant.06/05/2018  Details of operation can be found in separate operative note.  Pateint had an uncomplicated postpartum course.  She is ambulating, tolerating a regular diet, passing flatus, and urinating well. Patient is discharged home in stable condition on  06/07/18                                                                        Post partum procedures:none  Complications: None  Physical exam on 06/07/2018: Vitals:   06/06/18 0816 06/06/18 0818 06/06/18 1656 06/06/18 2313  BP: 107/72 107/72 109/72 120/81  Pulse: 91 91 98 95  Resp: 20 20 18 18   Temp: 98.8 F (37.1 C) 98.8 F (37.1 C) 98.9 F (37.2 C) 98.2 F (36.8 C)  TempSrc: Oral Oral Oral Oral  SpO2: 97%   96%  Weight:      Height:       General: alert, cooperative and no distress Lochia: appropriate Uterine Fundus: firm Incision: Healing well with no significant drainage, No significant erythema, Dressing is clean, dry, and intact DVT Evaluation: No evidence of DVT seen on physical exam. Negative Homan's sign.  Labs: Lab Results  Component Value Date   WBC 10.4 06/06/2018   HGB 10.8 (L) 06/06/2018   HCT 31.5 (L) 06/06/2018   MCV 95.2 06/06/2018   PLT 138 (L) 06/06/2018   CMP Latest Ref Rng &  Units 07/04/2015  Glucose 65 - 99 mg/dL 95  BUN 6 - 20 mg/dL 7  Creatinine 5.95 - 6.38 mg/dL 7.56  Sodium 433 - 295 mmol/L 140  Potassium 3.5 - 5.1 mmol/L 3.4(L)  Chloride 101 - 111 mmol/L 103  CO2 22 - 32 mmol/L 29  Calcium 8.9 - 10.3 mg/dL 8.9  Total Protein 6.5 - 8.1 g/dL 7.0  Total Bilirubin 0.3 - 1.2 mg/dL 1.0  Alkaline Phos 38 - 126 U/L 44  AST 15 - 41 U/L 30  ALT 14 - 54 U/L 31    Discharge instruction: per After Visit Summary.  Medications:  Allergies as of 06/07/2018   No Known Allergies     Medication List    TAKE these medications   multivitamin-prenatal 27-0.8 MG Tabs tablet Take 1 tablet by mouth daily. Takes at night   oxyCODONE-acetaminophen 5-325 MG tablet Commonly known as:  PERCOCET/ROXICET Take 1-2 tablets by mouth every 4 (four) hours as needed for moderate pain.       Diet: routine diet  Activity: Advance as  tolerated. Pelvic rest for 6 weeks.   Outpatient follow up: Follow-up Information    Nadara Mustard, MD. Go in 2 week(s).   Specialty:  Obstetrics and Gynecology Contact information: [address] [place] [address] [phone]             Postpartum contraception: Nexplanon Rhogam Given postpartum: no Rubella vaccine given postpartum: no Varicella vaccine given postpartum: no TDaP given antepartum or postpartum: Yes  Newborn Data: Live born female  Birth Weight: 7 lb 3 oz (3260 g) APGAR: 8, 9  Newborn Delivery   Birth date/time:  06/05/2018 07:50:00 Delivery type:  C-Section, Low Transverse Trial of labor:  No C-section categorization:  Repeat      Baby Feeding: Breast  Disposition:home with mother  SIGNED: [name], MD 06/07/2018 7:29 AM

## 2018-06-05 NOTE — Anesthesia Preprocedure Evaluation (Signed)
Anesthesia Evaluation  Patient identified by MRN, date of birth, ID band Patient awake    Reviewed: Allergy & Precautions, H&P , NPO status , Patient's Chart, lab work & pertinent test results  History of Anesthesia Complications Negative for: history of anesthetic complications  Airway Mallampati: III  TM Distance: >3 FB Neck ROM: full    Dental  (+) Chipped   Pulmonary neg pulmonary ROS, neg shortness of breath,           Cardiovascular Exercise Tolerance: Good (-) hypertensionnegative cardio ROS       Neuro/Psych  Headaches,    GI/Hepatic negative GI ROS, neg GERD  ,  Endo/Other    Renal/GU   negative genitourinary   Musculoskeletal   Abdominal   Peds  Hematology negative hematology ROS (+)   Anesthesia Other Findings Past Medical History: No date: Abnormal Pap smear of cervix     Comment:  09-17-2011 ASCUS +HPV,12/2012 ASC-US No date: Migraines     Comment:  had until [redacted] weeks pregnant, none since No date: Oligomenorrhea No date: STD (sexually transmitted disease)     Comment:  HPV  Past Surgical History: 05/23/2016: CESAREAN SECTION; N/A     Comment:  Procedure: CESAREAN SECTION;  Surgeon: Vena Austria,              MD;  Location: [facility] ORS;  Service: Obstetrics;                Laterality: N/A; 06-05-2012: COLPOSCOPY     Comment:  ECC neg  BMI    Body Mass Index:  42.51 kg/m      Reproductive/Obstetrics (+) Pregnancy                             Anesthesia Physical Anesthesia Plan  ASA: III  Anesthesia Plan: Spinal   Post-op Pain Management:    Induction:   PONV Risk Score and Plan:   Airway Management Planned: Natural Airway and Nasal Cannula  Additional Equipment:   Intra-op Plan:   Post-operative Plan:   Informed Consent: I have reviewed the patients History and Physical, chart, labs and discussed the procedure including the risks, benefits and  alternatives for the proposed anesthesia with the patient or authorized representative who has indicated his/her understanding and acceptance.     Dental Advisory Given  Plan Discussed with: Anesthesiologist, CRNA and Surgeon  Anesthesia Plan Comments: (Patient reports no bleeding problems and no anticoagulant use.  Plan for spinal with backup GA  Patient consented for risks of anesthesia including but not limited to:  - adverse reactions to medications - risk of bleeding, infection, nerve damage and headache - risk of failed spinal - damage to teeth, lips or other oral mucosa - sore throat or hoarseness - Damage to heart, brain, lungs or loss of life  Patient voiced understanding.)        Anesthesia Quick Evaluation

## 2018-06-05 NOTE — Anesthesia Procedure Notes (Signed)
Date/Time: 06/05/2018 7:45 AM Performed by: Henrietta Hoover, CRNA Pre-anesthesia Checklist: Patient identified, Emergency Drugs available, Suction available, Patient being monitored and Timeout performed Patient Re-evaluated:Patient Re-evaluated prior to induction Oxygen Delivery Method: Nasal cannula Placement Confirmation: positive ETCO2

## 2018-06-05 NOTE — Lactation Note (Signed)
This note was copied from a baby's chart. Lactation Consultation Note  Patient Name: Boy Gypsie Lybrook Today's Date: 06/05/2018 Reason for consult: Initial assessment   Maternal Data Does the patient have breastfeeding experience prior to this delivery?: No  Feeding Feeding Type: Breast Fed  LATCH Score Latch: Repeated attempts needed to sustain latch, nipple held in mouth throughout feeding, stimulation needed to elicit sucking reflex.  Audible Swallowing: A few with stimulation  Type of Nipple: Everted at rest and after stimulation  Comfort (Breast/Nipple): Soft / non-tender  Hold (Positioning): Assistance needed to correctly position infant at breast and maintain latch.  LATCH Score: 7  Interventions Interventions: Assisted with latch;Support pillows;Adjust position;Breast feeding basics reviewed  Lactation Tools Discussed/Used     Consult Status  Mother states that she attempted to breastfeed her first child but it did not go so well. LC assisted with adjusting infant's position and infant latched after a few attempts. Mother denies any pain or discomfort and was educated on infant feeding cues.    Arlyss Gandy 06/05/2018, 10:17 AM

## 2018-06-05 NOTE — Transfer of Care (Signed)
Immediate Anesthesia Transfer of Care Note  Patient: Amber Graves  Procedure(s) Performed: CESAREAN SECTION (N/A )  Patient Location: PACU  Anesthesia Type:Spinal  Level of Consciousness: awake, alert  and oriented  Airway & Oxygen Therapy: Patient Spontanous Breathing and Patient connected to nasal cannula oxygen  Post-op Assessment: Report given to RN and Post -op Vital signs reviewed and stable  Post vital signs: Reviewed and stable  Last Vitals:  Vitals Value Taken Time  BP 87/55 06/05/2018  8:41 AM  Temp 36.6 C 06/05/2018  8:41 AM  Pulse 88 06/05/2018  8:41 AM  Resp 25 06/05/2018  8:41 AM  SpO2 98 % 06/05/2018  8:41 AM    Last Pain:  Vitals:   06/05/18 0841  TempSrc: Oral  PainSc:          Complications: No apparent anesthesia complications

## 2018-06-06 LAB — CBC
HCT: 31.5 % — ABNORMAL LOW (ref 36.0–46.0)
Hemoglobin: 10.8 g/dL — ABNORMAL LOW (ref 12.0–15.0)
MCH: 32.6 pg (ref 26.0–34.0)
MCHC: 34.3 g/dL (ref 30.0–36.0)
MCV: 95.2 fL (ref 80.0–100.0)
NRBC: 0 % (ref 0.0–0.2)
Platelets: 138 10*3/uL — ABNORMAL LOW (ref 150–400)
RBC: 3.31 MIL/uL — ABNORMAL LOW (ref 3.87–5.11)
RDW: 13.4 % (ref 11.5–15.5)
WBC: 10.4 10*3/uL (ref 4.0–10.5)

## 2018-06-06 LAB — RPR: RPR Ser Ql: NONREACTIVE

## 2018-06-06 MED ORDER — IBUPROFEN 400 MG PO TABS
400.0000 mg | ORAL_TABLET | Freq: Four times a day (QID) | ORAL | Status: DC | PRN
  Administered 2018-06-06 – 2018-06-07 (×4): 400 mg via ORAL
  Filled 2018-06-06 (×5): qty 1

## 2018-06-06 MED ORDER — OXYCODONE-ACETAMINOPHEN 5-325 MG PO TABS: 1.0000 | ORAL_TABLET | ORAL | Status: DC | PRN

## 2018-06-06 NOTE — Lactation Note (Addendum)
This note was copied from a baby's chart. Lactation Consultation Note  [name] [name] WNIOE'[id number] Date: 06/06/2018 Reason for consult: Follow-up assessment;Term;Other (Comment)(8% weight loss)  Observed mom breast feeding with strong rhythmic sucking with infrequent swallows.  When demonstrated how to hand express, it took several attempts from both breasts to see one drop.  She latched [name] independently without assistance and seems comfortable.  [name] appears contented after breast feeding.  Mom reports tender nipples.  Coconut oil and comfort gels and instructed in alternating use.  Mom reports having milk supply issues with first and started doing breast and bottle early on even though she continued breast feeding for 4 months.  Mom reports [name] having an 8% weight loss tonight, but only saw 6% weight loss.  Discussed possibility of supplementing if continued to lose more weight.  Mom does not want to start supplementation.  Discussed other feeding alternatives.  Will re evaluate in morning as long as [name] does not begin to demonstrate any other medical symptoms.  Reviewed supply and demand, normal course of lactation and routine newborn feeding patterns.  Lactation name and number on white board and encouraged to call with any questions, concerns or assistance.   Maternal Data Formula Feeding for Exclusion: No Has patient been taught Hand Expression?: Yes(Having difficulty getting any colostrum when hand express) Does the patient have breastfeeding experience prior to this delivery?: Yes  Feeding Feeding Type: Breast Fed  LATCH Score Latch: Repeated attempts needed to sustain latch, nipple held in mouth throughout feeding, stimulation needed to elicit sucking reflex.  Audible Swallowing: A few with stimulation  Type of Nipple: Everted at rest and after stimulation(Different shaped nipples)  Comfort (Breast/Nipple): Filling, red/small blisters or bruises, mild/mod  discomfort  Hold (Positioning): No assistance needed to correctly position infant at breast.  LATCH Score: 7  Interventions Interventions: Breast feeding basics reviewed;Reverse pressure;Breast compression;Support pillows;Coconut oil;Comfort gels  Lactation Tools Discussed/Used WIC Program: [employer])   Consult Status Consult Status: Follow-up Follow-up type: Call as needed    Louis Meckel 06/06/2018, 9:38 PM

## 2018-06-06 NOTE — Progress Notes (Signed)
Admit Date: 06/05/2018 Today's Date: 06/06/2018  Subjective: Postpartum Day 1: Cesarean Delivery Patient reports tolerating PO, + flatus and no problems voiding.    Objective: Vital signs in last 24 hours: Temp:  [98.2 F (36.8 C)-99.1 F (37.3 C)] 98.8 F (37.1 C) (03/21 0818) Pulse Rate:  [86-116] 91 (03/21 0818) Resp:  [11-20] 20 (03/21 0818) BP: (98-118)/(58-85) 107/72 (03/21 0818) SpO2:  [95 %-100 %] 97 % (03/21 0816)  Physical Exam:  General: alert, cooperative and no distress Lochia: appropriate Uterine Fundus: firm Incision: healing well, no significant drainage, no dehiscence, no significant erythema DVT Evaluation: No evidence of DVT seen on physical exam. Negative Homan's sign.  Recent Labs    06/04/18 0830 06/06/18 0648  HGB 13.7 10.8*  HCT 39.5 31.5*    Assessment/Plan: Status post Cesarean section. Doing well postoperatively.  Continue current care. Breast feeding Plans Nexplanon  Amber Graves 06/06/2018, 9:35 AM

## 2018-06-06 NOTE — Anesthesia Postprocedure Evaluation (Signed)
Anesthesia Post Note  Patient: Amber Graves  Procedure(s) Performed: CESAREAN SECTION (N/A )  Patient location during evaluation: PACU Anesthesia Type: Spinal Level of consciousness: oriented and awake and alert Pain management: pain level controlled Vital Signs Assessment: post-procedure vital signs reviewed and stable Respiratory status: spontaneous breathing, respiratory function stable and patient connected to nasal cannula oxygen Cardiovascular status: blood pressure returned to baseline and stable Postop Assessment: no headache, no backache and no apparent nausea or vomiting Anesthetic complications: no     Last Vitals:  Vitals:   06/06/18 0816 06/06/18 0818  BP: 107/72 107/72  Pulse: 91 91  Resp: 20 20  Temp: 37.1 C 37.1 C  SpO2: 97%     Last Pain:  Vitals:   06/06/18 1032  TempSrc:   PainSc: 0-No pain                 Vena Bassinger S

## 2018-06-07 ENCOUNTER — Encounter: Payer: Self-pay | Admitting: Obstetrics & Gynecology

## 2018-06-07 MED ORDER — OXYCODONE-ACETAMINOPHEN 5-325 MG PO TABS: 1.0000 | ORAL_TABLET | ORAL | 0 refills | Status: DC | PRN

## 2018-06-07 NOTE — Progress Notes (Signed)
Discharge instructions given. Patient verbalizes understanding of teaching. Patient discharged home via wheelchair at 1015.

## 2018-06-07 NOTE — Progress Notes (Signed)
Admit Date: 06/05/2018 Today's Date: 06/07/2018  Subjective: Postpartum Day 2: Cesarean Delivery Patient reports tolerating PO, + flatus and no problems voiding.    Objective: Vital signs in last 24 hours: Temp:  [98.2 F (36.8 C)-98.9 F (37.2 C)] 98.2 F (36.8 C) (03/21 2313) Pulse Rate:  [91-98] 95 (03/21 2313) Resp:  [18-20] 18 (03/21 2313) BP: (107-120)/(72-81) 120/81 (03/21 2313) SpO2:  [96 %-97 %] 96 % (03/21 2313)  Physical Exam:  General: alert, cooperative and no distress Lochia: appropriate Uterine Fundus: firm Incision: healing well DVT Evaluation: No evidence of DVT seen on physical exam. Negative Homan's sign.  Recent Labs    06/04/18 0830 06/06/18 0648  HGB 13.7 10.8*  HCT 39.5 31.5*    Assessment/Plan: Status post Cesarean section. Doing well postoperatively.  Discharge home with standard precautions and return to clinic in 2 weeks.  [name] 06/07/2018, 7:29 AM

## 2018-06-19 ENCOUNTER — Other Ambulatory Visit: Payer: Self-pay

## 2018-06-19 ENCOUNTER — Encounter: Payer: Self-pay | Admitting: Obstetrics & Gynecology

## 2018-06-19 ENCOUNTER — Ambulatory Visit (INDEPENDENT_AMBULATORY_CARE_PROVIDER_SITE_OTHER): Payer: BLUE CROSS/BLUE SHIELD | Admitting: Obstetrics & Gynecology

## 2018-06-19 VITALS — Ht 61.0 in | Wt 209.0 lb

## 2018-06-19 DIAGNOSIS — E669 Obesity, unspecified: Secondary | ICD-10-CM | POA: Insufficient documentation

## 2018-06-19 DIAGNOSIS — Z9889 Other specified postprocedural states: Secondary | ICD-10-CM

## 2018-06-19 DIAGNOSIS — Z98891 History of uterine scar from previous surgery: Secondary | ICD-10-CM

## 2018-06-19 NOTE — Progress Notes (Signed)
Virtual Visit via Telephone Note  I connected with [place] on 06/19/18 at  9:00 AM EDT by telephone and verified that I am speaking with the correct person using two identifiers.   I discussed the limitations, risks, security and privacy concerns of performing an evaluation and management service by telephone and the availability of in person appointments. I also discussed with the patient that there may be a patient responsible charge related to this service. The patient expressed understanding and agreed to proceed.  She was at home and I was in my office.  History of Present Illness: Pt is a 33 yo G2P2 s/p CS 2 weeks ago fro repeat, also has obesity as risk factor for pregnancy and post partum.  Reports min pain, no bleeding or lochia, breast feeding with pain or mass or redness, no depression; infant doing well.   Observations/Objective: No exam today, due to telephone eVisit due to [facility] virus restriction on elective visits and procedures.  Prior visits reviewed along with ultrasounds/labs as indicated.  Assessment and Plan: 1. History of low transverse cesarean section, now delivered Recovering well per pt Cont breast feeding, monitor for s/sx mastitis Monitor for PPD Plan Nexplanon next visit Plan PAP next visit  2. Obesity Increase exercise, still restricted from heavy lifting Diet options discussed  Follow Up Instructions: 4 weeks   I discussed the assessment and treatment plan with the patient. The patient was provided an opportunity to ask questions and all were answered. The patient agreed with the plan and demonstrated an understanding of the instructions.   The patient was advised to call back or seek an in-person evaluation if the symptoms worsen or if the condition fails to improve as anticipated.  I provided 7 minutes of non-face-to-face time during this encounter.  [name], MD [facility], [facility] 06/19/2018  9:13  AM

## 2018-07-23 ENCOUNTER — Other Ambulatory Visit (HOSPITAL_COMMUNITY)
Admission: RE | Admit: 2018-07-23 | Discharge: 2018-07-23 | Disposition: A | Discharge status: Home / Self Care | Payer: BLUE CROSS/BLUE SHIELD | Source: Ambulatory Visit | Attending: Obstetrics & Gynecology | Admitting: Obstetrics & Gynecology

## 2018-07-23 ENCOUNTER — Encounter: Payer: Self-pay | Admitting: Obstetrics & Gynecology

## 2018-07-23 ENCOUNTER — Other Ambulatory Visit: Payer: Self-pay

## 2018-07-23 ENCOUNTER — Ambulatory Visit (INDEPENDENT_AMBULATORY_CARE_PROVIDER_SITE_OTHER): Payer: BLUE CROSS/BLUE SHIELD | Admitting: Obstetrics & Gynecology

## 2018-07-23 VITALS — BP 120/80 | Ht 61.0 in | Wt 214.0 lb

## 2018-07-23 DIAGNOSIS — Z124 Encounter for screening for malignant neoplasm of cervix: Secondary | ICD-10-CM

## 2018-07-23 DIAGNOSIS — Z1389 Encounter for screening for other disorder: Secondary | ICD-10-CM

## 2018-07-23 DIAGNOSIS — Z30017 Encounter for initial prescription of implantable subdermal contraceptive: Secondary | ICD-10-CM

## 2018-07-23 NOTE — Progress Notes (Signed)
  OBSTETRICS POSTPARTUM CLINIC PROGRESS NOTE  Subjective:     Amber Graves is a 33 y.o. G6P2002 female who presents for a postpartum visit. She is 6 weeks postpartum following a Term pregnancy and delivery by C-section.  I have fully reviewed the prenatal and intrapartum course. Anesthesia: spinal.  Postpartum course has been complicated by uncomplicated.  Baby is feeding by Breast.  Bleeding: patient has not  resumed menses.  Bowel function is normal. Bladder function is normal.  Patient is not sexually active. Contraception method desired is Nexplanon.  Postpartum depression screening: negative. Edinburgh 0.  The following portions of the patient's history were reviewed and updated as appropriate: allergies, current medications, past family history, past medical history, past social history, past surgical history and problem list.  Review of Systems Pertinent items are noted in HPI.  Objective:    BP 120/80   Ht 5\' 1"  (1.549 m)   Wt 214 lb (97.1 kg)   BMI 40.43 kg/m   General:  alert and no distress   Breasts:  inspection negative, no nipple discharge or bleeding, no masses or nodularity palpable  Lungs: clear to auscultation bilaterally  Heart:  regular rate and rhythm, S1, S2 normal, no murmur, click, rub or gallop  Abdomen: soft, non-tender; bowel sounds normal; no masses,  no organomegaly.  Well healed Pfannenstiel incision   Vulva:  normal  Vagina: normal vagina, no discharge, exudate, lesion, or erythema  Cervix:  no cervical motion tenderness and no lesions  Corpus: normal size, contour, position, consistency, mobility, non-tender  Adnexa:  normal adnexa and no mass, fullness, tenderness  Rectal Exam: Not performed.          Assessment:  Post Partum Care visit 1. Screening for cervical cancer - Cytology - PAP  2. Postpartum care and examination  3. Encounter for Nexplanon  Plan:  See orders and Patient Instructions Resume all normal activities Follow  up in: 12 months or as needed.     Nexplanon Insertion  Patient given informed consent, signed copy in the chart, time out was performed. Appropriate time out taken.  Patient's Left arm was prepped and draped in the usual sterile fashion.. The ruler used to measure and mark insertion area.  Pt was prepped with betadine swab and then injected with 3 cc of 2% lidocaine with epinephrine. Nexplanon removed form packaging,  Device confirmed in needle, then inserted full length of needle and withdrawn per handbook instructions.  Pt insertion site covered with steri-strip and a bandage.   Minimal blood loss.  Pt tolerated the procedure well.   Annamarie Major, MD, [facility], [facility] 07/23/2018  4:18 PM

## 2018-07-23 NOTE — Patient Instructions (Addendum)
Nexplanon Instructions After Insertion  Keep bandage clean and dry for 24 hours  May use ice/Tylenol/Ibuprofen for soreness or pain  If you develop fever, drainage or increased warmth from incision site-contact office immediately   

## 2018-07-29 LAB — CYTOLOGY - PAP
Diagnosis: NEGATIVE
HPV: NOT DETECTED

## 2020-12-06 ENCOUNTER — Encounter: Payer: Self-pay | Admitting: Obstetrics & Gynecology

## 2020-12-06 ENCOUNTER — Other Ambulatory Visit: Payer: Self-pay

## 2020-12-06 ENCOUNTER — Ambulatory Visit: Payer: BC Managed Care – PPO | Admitting: Obstetrics & Gynecology

## 2020-12-06 VITALS — BP 120/70 | Ht 61.0 in | Wt 219.0 lb

## 2020-12-06 DIAGNOSIS — Z30011 Encounter for initial prescription of contraceptive pills: Secondary | ICD-10-CM

## 2020-12-06 DIAGNOSIS — Z3046 Encounter for surveillance of implantable subdermal contraceptive: Secondary | ICD-10-CM | POA: Diagnosis not present

## 2020-12-06 MED ORDER — JUNEL FE 24 1-20 MG-MCG(24) PO TABS: 1.0000 | ORAL_TABLET | Freq: Every day | ORAL | 0 refills | Status: DC

## 2020-12-06 NOTE — Patient Instructions (Signed)
Norethindrone Acetate; Ethinyl Estradiol; Ferrous Fumarate Capsules or Tablets What is this medication? NORETHINDRONE ACETATE; ETHINYL ESTRADIOL; FERROUS FUMARATE (nor eth IN drone AS e tate; ETH in il es tra DYE ole; FER us FUE ma rate) prevents ovulation and pregnancy. It may also be used to treat acne. It belongs to a group of medications called oral contraceptives. It is a combination of the hormonesestrogen and progestin. This medicine may be used for other purposes; ask your health care provider orpharmacist if you have questions. COMMON BRAND NAME(S): Aurovela 24 Fe 1/20, Aurovela Fe, Blisovi 24 Fe, Blisovi Fe, Estrostep Fe, Gemmily, Gildess 24 Fe, Gildess Fe 1.5/30, Gildess Fe 1/20, Hailey 24 Fe, Hailey Fe 1.5/30, Junel Fe 1.5/30, Junel Fe 1/20, Junel Fe 24, Larin Fe, Lo Loestrin Fe, Loestrin 24 Fe, Loestrin FE 1.5/30, Loestrin FE 1/20, Lomedia 24 Fe, Merzee, Microgestin 24 Fe, Microgestin Fe 1.5/30, Microgestin Fe1/20, Tarina 24 Fe, Tarina Fe 1/20, Taysofy, Taytulla, Tilia Fe, Tri-Legest Fe What should I tell my care team before I take this medication? They need to know if you have any of these conditions: Abnormal vaginal bleeding Blood vessel disease or blood clots Breast, cervical, endometrial, ovarian, liver, or uterine cancer Diabetes Gallbladder disease Having surgery Heart disease or recent heart attack High blood pressure High cholesterol or triglycerides History of irregular heartbeat or heart valve problems Kidney disease Liver disease Migraine headaches Protein C deficiency Protein S deficiency Recently had a baby, miscarriage, or abortion Stroke Systemic lupus erythematosus (SLE) Tobacco smoker An unusual or allergic reaction to estrogens, progestins, other medications, foods, dyes, or preservatives Pregnant or trying to get pregnant Breast-feeding How should I use this medication? Take this medication by mouth. To reduce nausea, this medication may be taken with  food. Follow the directions on the prescription label. Take this medication at the same time each day and in the order directed on the package.Do not take your medication more often than directed. A patient package insert for the product will be given with each prescription and refill. Read this sheet carefully each time. The sheet may changefrequently. Contact your care team regarding the use of this medication in children. Special care may be needed. This medication has been used in female childrenwho have started having menstrual periods. Overdosage: If you think you have taken too much of this medicine contact apoison control center or emergency room at once. NOTE: This medicine is only for you. Do not share this medicine with others. What if I miss a dose? If you miss a dose, refer to the patient information sheet you received with your medication for direction. If you miss more than one pill, this medicationmay not be as effective, and you may need to use another form of birth control. What may interact with this medication? Do not take this medication with the following: Dasabuvir; ombitasvir; paritaprevir; ritonavir Ombitasvir; paritaprevir; ritonavir This medication may also interact with the following: Acetaminophen Antibiotics or medications for infections, especially rifampin, rifabutin, rifapentine, and griseofulvin, and possibly penicillins or tetracyclines Aprepitant Ascorbic acid (vitamin C) Atorvastatin Barbiturate medications, such as phenobarbital Bosentan Carbamazepine Caffeine Clofibrate Cyclosporine Dantrolene Doxercalciferol Felbamate Grapefruit juice Hydrocortisone Medications for anxiety or sleeping problems, such as diazepam or temazepam Medications for diabetes, including pioglitazone Mineral oil Modafinil Mycophenolate Nefazodone Oxcarbazepine Phenytoin Prednisolone Ritonavir or other medications for HIV infection or AIDS Rosuvastatin Selegiline Soy  isoflavones supplements St. John's wort Tamoxifen or raloxifene Theophylline Thyroid hormones Topiramate Warfarin This list may not describe all possible interactions. Give your health   care provider a list of all the medicines, herbs, non-prescription drugs, or dietary supplements you use. Also tell them if you smoke, drink alcohol, or use illegaldrugs. Some items may interact with your medicine. What should I watch for while using this medication? Visit your care team for regular checks on your progress. You will need aregular breast and pelvic exam and Pap smear while on this medication. Use an additional method of contraception during the first cycle that you takethese tablets. If you have any reason to think you are pregnant, stop taking this medicationright away and contact your care team. If you are taking this medication for hormone related problems, it may takeseveral cycles of use to see improvement in your condition. Smoking increases the risk of getting a blood clot or having a stroke while you are taking birth control pills, especially if you are more than 35 years old.You are strongly advised not to smoke. This medication can make your body retain fluid, making your fingers, hands, or ankles swell. Your blood pressure can go up. Contact your care team if you feelyou are retaining fluid. This medication can make you more sensitive to the sun. Keep out of the sun. If you cannot avoid being in the sun, wear protective clothing and use sunscreen.Do not use sun lamps or tanning beds/booths. If you wear contact lenses and notice visual changes, or if the lenses begin tofeel uncomfortable, consult your eye care specialist. In some women, tenderness, swelling, or minor bleeding of the gums may occur. Notify your dentist if this happens. Brushing and flossing your teeth regularly may help limit this. See your dentist regularly and inform your dentist of themedications you are taking. If you are  going to have elective surgery, you may need to stop taking thismedication before the surgery. Consult your care team for advice. This medication does not protect you against HIV infection (AIDS) or any othersexually transmitted diseases. What side effects may I notice from receiving this medication? Side effects that you should report to your care team as soon as possible: Allergic reactions-skin rash, itching, hives, swelling of the face, lips, tongue, or throat Blood clot-pain, swelling, or warmth in the leg, shortness of breath, chest pain Gallbladder problems-severe stomach pain, nausea, vomiting, fever Increase in blood pressure Liver injury-right upper belly pain, loss of appetite, light-colored stool, dark yellow or brown urine, yellowing skin or eyes, unusual weakness or fatigue New or worsening migraines or headaches Stroke-sudden numbness or weakness of the face, arm or leg, trouble speaking, confusion, trouble walking, loss of balance or coordination, dizziness, severe headache, change in vision Unusual vaginal discharge, itching, or odor Worsening mood, feelings of depression Side effects that usually do not require medical attention (report these toyour care team if they continue or are bothersome): Breast pain or tenderness Dark patches of skin on the face or other sun-exposed areas Irregular menstrual cycles or spotting Nausea Weight gain This list may not describe all possible side effects. Call your doctor for medical advice about side effects. You may report side effects to FDA at1-800-FDA-1088. Where should I keep my medication? Keep out of the reach of children and pets. Store at room temperature between 15 and 30 degrees C (59 and 86 degrees F).Throw away any unused medication after the expiration date. NOTE: This sheet is a summary. It may not cover all possible information. If you have questions about this medicine, talk to your doctor, pharmacist, orhealth care  provider.  2022 Elsevier/Gold Standard (2020-01-24 12:27:45)    you have questions about this medicine,  talk to your doctor, pharmacist, or health care provider.  2022 Elsevier/Gold Standard (2020-01-24 12:27:45)

## 2020-12-06 NOTE — Progress Notes (Signed)
Contraception Counseling Patient is a 35 yo G2P2 WF who presents for contraception counseling. The patient has no complaints today. The patient is sexually active. Pertinent past medical history: none.  She has been on Nexplanon for 2 years.  She has good period control. However, she feels it contributes to fatigue, weight gain, and headaches.  She desires OCPs.  PMHx: She  has a past medical history of Abnormal Pap smear of cervix, Migraines, Oligomenorrhea, and STD (sexually transmitted disease). Also,  has a past surgical history that includes Colposcopy (06-05-2012); Cesarean section (N/A, 05/23/2016); and Cesarean section (N/A, 06/05/2018)., family history includes Diabetes in her paternal aunt, paternal grandfather, paternal grandmother, and paternal uncle; Heart disease in her maternal grandfather; Stroke in her paternal grandmother; Thyroid disease in her maternal grandmother and mother; Tongue cancer in her mother.,  reports that she has never smoked. She has never used smokeless tobacco. She reports current alcohol use. She reports that she does not use drugs.  She has a current medication list which includes the following prescription(s): junel fe 24, oxycodone-acetaminophen, and multivitamin-prenatal. Also, has No Known Allergies.  Review of Systems  Constitutional:  Negative for chills, fever and malaise/fatigue.  HENT:  Negative for congestion, sinus pain and sore throat.   Eyes:  Negative for blurred vision and pain.  Respiratory:  Negative for cough and wheezing.   Cardiovascular:  Negative for chest pain and leg swelling.  Gastrointestinal:  Negative for abdominal pain, constipation, diarrhea, heartburn, nausea and vomiting.  Genitourinary:  Negative for dysuria, frequency, hematuria and urgency.  Musculoskeletal:  Negative for back pain, joint pain, myalgias and neck pain.  Skin:  Negative for itching and rash.  Neurological:  Negative for dizziness, tremors and weakness.   Endo/Heme/Allergies:  Does not bruise/bleed easily.  Psychiatric/Behavioral:  Negative for depression. The patient is not nervous/anxious and does not have insomnia.    Objective: BP 120/70   Ht 5\' 1"  (1.549 m)   Wt 219 lb (99.3 kg)   LMP 11/26/2020   BMI 41.38 kg/m  Physical Exam Constitutional:      General: She is not in acute distress.    Appearance: She is well-developed.  Musculoskeletal:        General: Normal range of motion.  Neurological:     Mental Status: She is alert and oriented to person, place, and time.  Skin:    General: Skin is warm and dry.  Vitals reviewed.    ASSESSMENT/PLAN:   Problem List Items Addressed This Visit     Encounter for initial prescription of contraceptive pills    -  Primary   Encounter for Nexplanon removal       All options d/w pt Info given OCPs The risks /benefits of OCPs have been explained to the patient in detail.  Product literature has been given to her.  I have instructed her in the use of OCPs and have given her literature reinforcing this information.  I have explained to the patient that OCPs are not as effective for birth control during the first month of use, and that another form of contraception should be used during this time.  Both first-day start and Sunday start have been explained.  The risks and benefits of each was discussed.  She has been made aware of  the fact that other medications may affect the efficacy of OCPs.  I have answered all of her questions, and I believe that she has an understanding of the effectiveness and use of OCPs.  Plan Annual and PAP/Lab appt soon  Nexplanon removal Procedure note - The Nexplanon was noted in the patient's arm and the end was identified. The skin was cleansed with a Betadine solution. A small injection of subcutaneous lidocaine with epinephrine was given over the end of the implant. An incision was made at the end of the implant. The rod was noted in the incision and grasped  with a hemostat. It was noted to be intact.  Steri-Strip was placed approximating the incision. Hemostasis was noted.  Annamarie Major, MD, [facility], [facility] 12/06/2020  3:41 PM

## 2021-01-17 ENCOUNTER — Ambulatory Visit (INDEPENDENT_AMBULATORY_CARE_PROVIDER_SITE_OTHER): Payer: BC Managed Care – PPO | Admitting: Obstetrics & Gynecology

## 2021-01-17 ENCOUNTER — Other Ambulatory Visit (HOSPITAL_COMMUNITY)
Admission: RE | Admit: 2021-01-17 | Discharge: 2021-01-17 | Disposition: A | Discharge status: Home / Self Care | Payer: BC Managed Care – PPO | Source: Ambulatory Visit | Attending: Obstetrics & Gynecology | Admitting: Obstetrics & Gynecology

## 2021-01-17 ENCOUNTER — Other Ambulatory Visit: Payer: Self-pay

## 2021-01-17 ENCOUNTER — Encounter: Payer: Self-pay | Admitting: Obstetrics & Gynecology

## 2021-01-17 VITALS — BP 120/80 | Ht 61.0 in | Wt 217.0 lb

## 2021-01-17 DIAGNOSIS — Z124 Encounter for screening for malignant neoplasm of cervix: Secondary | ICD-10-CM

## 2021-01-17 DIAGNOSIS — Z01419 Encounter for gynecological examination (general) (routine) without abnormal findings: Secondary | ICD-10-CM | POA: Diagnosis not present

## 2021-01-17 NOTE — Progress Notes (Signed)
HPI:      [name] is a 35 y.o. [id number] who LMP was No LMP recorded., she presents today for her annual examination. The patient has no complaints today. Recent change from Nexplanon to OCP.  No period yet (none on Nexplanon prior).  Weight unchanged, desires to lose weight.   The patient is sexually active. Her last pap: was normal. The patient does perform self breast exams.  There is no notable family history of breast or ovarian cancer in her family.  The patient has regular exercise: yes.  The patient denies current symptoms of depression.    GYN History: Contraception: OCP (estrogen/progesterone)  PMHx: Past Medical History:  Diagnosis Date   Abnormal Pap smear of cervix    09-17-2011 ASCUS +HPV,12/2012 ASC-US   Migraines    had until [redacted] weeks pregnant, none since   Oligomenorrhea    STD (sexually transmitted disease)    HPV   Past Surgical History:  Procedure Laterality Date   CESAREAN SECTION N/A 05/23/2016   Procedure: CESAREAN SECTION;  Surgeon: Vena Austria, MD;  Location: [facility];  Service: Obstetrics;  Laterality: N/A;   CESAREAN SECTION N/A 06/05/2018   Procedure: CESAREAN SECTION;  Surgeon: Nadara Mustard, MD;  Location: [facility];  Service: Obstetrics;  Laterality: N/A;   COLPOSCOPY  06-05-2012   ECC neg   Family History  Problem Relation Age of Onset   Tongue cancer Mother    Thyroid disease Mother    Diabetes Paternal Uncle    Diabetes Paternal Grandmother    Stroke Paternal Grandmother    Diabetes Paternal Grandfather    Thyroid disease Maternal Grandmother    Heart disease Maternal Grandfather    Diabetes Paternal Aunt    Social History   Tobacco Use   Smoking status: Never   Smokeless tobacco: Never  Vaping Use   Vaping Use: Never used  Substance Use Topics   Alcohol use: Yes    Alcohol/week: 0.0 - 1.0 standard drinks   Drug use: No    Current Outpatient Medications:    Norethindrone Acetate-Ethinyl Estrad-FE (JUNEL FE 24)  1-20 MG-MCG(24) tablet, Take 1 tablet by mouth daily., Disp: 84 tablet, Rfl: 0 Allergies: Patient has no known allergies.  Review of Systems  Constitutional:  Negative for chills, fever and malaise/fatigue.  HENT:  Negative for congestion, sinus pain and sore throat.   Eyes:  Negative for blurred vision and pain.  Respiratory:  Negative for cough and wheezing.   Cardiovascular:  Negative for chest pain and leg swelling.  Gastrointestinal:  Negative for abdominal pain, constipation, diarrhea, heartburn, nausea and vomiting.  Genitourinary:  Negative for dysuria, frequency, hematuria and urgency.  Musculoskeletal:  Negative for back pain, joint pain, myalgias and neck pain.  Skin:  Negative for itching and rash.  Neurological:  Negative for dizziness, tremors and weakness.  Endo/Heme/Allergies:  Does not bruise/bleed easily.  Psychiatric/Behavioral:  Negative for depression. The patient is not nervous/anxious and does not have insomnia.    Objective: BP 120/80   Ht 5\' 1"  (1.549 m)   Wt 217 lb (98.4 kg)   BMI 41.00 kg/m   Filed Weights   01/17/21 1528  Weight: 217 lb (98.4 kg)   Body mass index is 41 kg/m. Physical Exam Constitutional:      General: She is not in acute distress.    Appearance: She is well-developed.  Genitourinary:     Bladder, rectum and urethral meatus normal.     No lesions  in the vagina.     Right Labia: No rash, tenderness or lesions.    Left Labia: No tenderness, lesions or rash.    No vaginal bleeding.      Right Adnexa: not tender and no mass present.    Left Adnexa: not tender and no mass present.    No cervical motion tenderness, friability, lesion or polyp.     Uterus is not enlarged.     No uterine mass detected.    Pelvic exam was performed with patient in the lithotomy position.  Breasts:    Right: No mass, skin change or tenderness.     Left: No mass, skin change or tenderness.  HENT:     Head: Normocephalic and atraumatic. No laceration.      Right Ear: Hearing normal.     Left Ear: Hearing normal.     Mouth/Throat:     Pharynx: Uvula midline.  Eyes:     Pupils: Pupils are equal, round, and reactive to light.  Neck:     Thyroid: No thyromegaly.  Cardiovascular:     Rate and Rhythm: Normal rate and regular rhythm.     Heart sounds: No murmur heard.   No friction rub. No gallop.  Pulmonary:     Effort: Pulmonary effort is normal. No respiratory distress.     Breath sounds: Normal breath sounds. No wheezing.  Abdominal:     General: Bowel sounds are normal. There is no distension.     Palpations: Abdomen is soft.     Tenderness: There is no abdominal tenderness. There is no rebound.  Musculoskeletal:        General: Normal range of motion.     Cervical back: Normal range of motion and neck supple.  Neurological:     Mental Status: She is alert and oriented to person, place, and time.     Cranial Nerves: No cranial nerve deficit.  Skin:    General: Skin is warm and dry.  Psychiatric:        Judgment: Judgment normal.  Vitals reviewed.    Assessment:  ANNUAL EXAM 1. Women's annual routine gynecological examination   2. Screening for malignant neoplasm of cervix      Screening Plan:            1.  Cervical Screening-  Pap smear done today  2. Breast screening- Exam annually and mammogram>40 planned   3.  Labs managed by PCP  4. Counseling for contraception: oral contraceptives (estrogen/progesterone)  The pregnancy intention screening data noted above was reviewed. Potential methods of contraception were discussed. The patient elected to proceed with Oral Contraceptive.      F/U  Return in about 1 year (around 01/17/2022) for Annual.  Barnett Applebaum, MD, [facility], [facility] 01/17/2021  3:52 PM

## 2021-01-19 LAB — CYTOLOGY - PAP
Comment: NEGATIVE
Diagnosis: NEGATIVE
High risk HPV: NEGATIVE

## 2021-03-08 ENCOUNTER — Other Ambulatory Visit: Payer: Self-pay | Admitting: Obstetrics & Gynecology

## 2021-04-25 ENCOUNTER — Other Ambulatory Visit: Payer: Self-pay

## 2021-04-25 ENCOUNTER — Ambulatory Visit: Payer: 59 | Admitting: Obstetrics and Gynecology

## 2021-04-25 ENCOUNTER — Encounter: Payer: Self-pay | Admitting: Obstetrics and Gynecology

## 2021-04-25 VITALS — BP 118/80 | Ht 61.0 in | Wt 219.0 lb

## 2021-04-25 DIAGNOSIS — N912 Amenorrhea, unspecified: Secondary | ICD-10-CM

## 2021-04-25 NOTE — Progress Notes (Signed)
Patient, No Pcp Per (Inactive)   Chief Complaint  Patient presents with   Amenorrhea    Hasn't had a cycle since last yr in September. No spotting. Neg UPT a couple weeks ago.    HPI:      [name] is a 36 y.o. [id number] whose LMP was Patient's last menstrual period was 11/27/2020 (approximate)., presents today for amenorrhea since nexplanon removed 9/22. Menses were every 1-4 months, lasting 6-7 days, mod to heavy flow, no BTB, mild dysmen. Pt had nexplanon removed after 2 1/2 yrs and changed to OCPs. No menses/BTB since. Hx of amenorrhea prior to [facility] in distant past; with acne/no unusual facial hair. Never did PCOS eval at that time.  Had monthly menses with OCPs in past, so concerned not having them now. Had fertility issues and had to treat with progesterone for withdrawal bleed. Had neg labs for PCOS in 2019 when on nexplanon. Pt having neg UPT each month, concerned about lack of bleeding. Not trying to conceive currently. Finishing placebo pills today, due to start new OCPs tomorrow.  Neg pap/neg HPV DNA 11/22  Patient Active Problem List   Diagnosis Date Noted   Obesity (BMI 30-39.9) 06/19/2018   History of low transverse cesarean section 10/29/2017    Past Surgical History:  Procedure Laterality Date   CESAREAN SECTION N/A 05/23/2016   Procedure: CESAREAN SECTION;  Surgeon: Malachy Mood, MD;  Location: [facility];  Service: Obstetrics;  Laterality: N/A;   CESAREAN SECTION N/A 06/05/2018   Procedure: CESAREAN SECTION;  Surgeon: Gae Dry, MD;  Location: [facility];  Service: Obstetrics;  Laterality: N/A;   COLPOSCOPY  06-05-2012   ECC neg    Family History  Problem Relation Age of Onset   Tongue cancer Mother    Thyroid disease Mother    Diabetes Paternal Aunt    Diabetes Paternal Uncle    Thyroid disease Maternal Grandmother    Heart disease Maternal Grandfather    Diabetes Paternal Grandmother    Stroke Paternal Grandmother    Diabetes Paternal  Grandfather     Social History   Socioeconomic History   Marital status: Married    Spouse name: Broadus John   Number of children: 1   Years of education: Not on file   Highest education level: Not on file  Occupational History   Occupation: Freight forwarder, dining services    Comment: [employer]  Tobacco Use   Smoking status: Never   Smokeless tobacco: Never  Vaping Use   Vaping Use: Never used  Substance and Sexual Activity   Alcohol use: Yes    Alcohol/week: 0.0 - 1.0 standard drinks   Drug use: No   Sexual activity: Yes    Partners: Male    Birth control/protection: Pill  Other Topics Concern   Not on file  Social History Narrative   Not on file   Social Determinants of Health   Financial Resource Strain: Not on file  Food Insecurity: Not on file  Transportation Needs: Not on file  Physical Activity: Not on file  Stress: Not on file  Social Connections: Not on file  Intimate Partner Violence: Not on file    Outpatient Medications Prior to Visit  Medication Sig Dispense Refill   HAILEY 24 FE 1-20 MG-MCG(24) tablet TAKE 1 TABLET BY MOUTH EVERY DAY 28 tablet 2   No facility-administered medications prior to visit.      ROS:  Review of Systems  Constitutional:  Negative for fever.  Gastrointestinal:  Negative for blood in stool, constipation, diarrhea, nausea and vomiting.  Genitourinary:  Positive for menstrual problem. Negative for dyspareunia, dysuria, flank pain, frequency, hematuria, urgency, vaginal bleeding, vaginal discharge and vaginal pain.  Musculoskeletal:  Negative for back pain.  Skin:  Negative for rash.  BREAST: No symptoms   OBJECTIVE:   Vitals:  BP 118/80    Ht 5\' 1"  (1.549 m)    Wt 219 lb (99.3 kg)    LMP 11/27/2020 (Approximate)    Breastfeeding No    BMI 41.38 kg/m   Physical Exam Vitals reviewed.  Constitutional:      Appearance: She is well-developed.  Pulmonary:     Effort: Pulmonary effort is normal.  Musculoskeletal:        General:  Normal range of motion.     Cervical back: Normal range of motion.  Skin:    General: Skin is warm and dry.  Neurological:     General: No focal deficit present.     Mental Status: She is alert and oriented to person, place, and time.     Cranial Nerves: No cranial nerve deficit.  Psychiatric:        Mood and Affect: Mood normal.        Behavior: Behavior normal.        Thought Content: Thought content normal.        Judgment: Judgment normal.    Assessment/Plan: Amenorrhea - Plan: Testosterone,Free and Total, FSH/LH, Estradiol, Prolactin, Progesterone, Progesterone, Prolactin, Estradiol, FSH/LH, Testosterone,Free and Total; check labs. If neg, most likely PCOS/reassurance. Long discussion about amenorrhea on OCPs vs off OCPs. Pt understands. Cont OCPs for now.     Return if symptoms worsen or fail to improve.  Emika Tiano B. Rida Loudin, PA-C 04/25/2021 4:38 PM

## 2021-05-01 LAB — FSH/LH
FSH: 9.3 m[IU]/mL
LH: 11.8 m[IU]/mL

## 2021-05-01 LAB — PROLACTIN: Prolactin: 4.9 ng/mL (ref 4.8–23.3)

## 2021-05-01 LAB — TESTOSTERONE,FREE AND TOTAL
Testosterone, Free: 0.2 pg/mL (ref 0.0–4.2)
Testosterone: 13 ng/dL (ref 8–60)

## 2021-05-01 LAB — ESTRADIOL: Estradiol: 53 pg/mL

## 2021-05-01 LAB — PROGESTERONE: Progesterone: 0.1 ng/mL

## 2021-05-31 ENCOUNTER — Other Ambulatory Visit: Payer: Self-pay | Admitting: Obstetrics & Gynecology

## 2021-11-30 ENCOUNTER — Other Ambulatory Visit: Payer: Self-pay

## 2021-11-30 MED ORDER — HAILEY 24 FE 1-20 MG-MCG(24) PO TABS: 1.0000 | ORAL_TABLET | Freq: Every day | ORAL | 0 refills | Status: DC

## 2022-02-24 ENCOUNTER — Other Ambulatory Visit: Payer: Self-pay | Admitting: Obstetrics and Gynecology

## 2022-02-25 ENCOUNTER — Other Ambulatory Visit: Payer: Self-pay | Admitting: Obstetrics and Gynecology

## 2022-03-25 ENCOUNTER — Other Ambulatory Visit: Payer: Self-pay | Admitting: Obstetrics and Gynecology

## 2022-04-22 NOTE — Progress Notes (Unsigned)
PCP:  Patient, No Pcp Per   No chief complaint on file.    HPI:      Amber Graves is a 37 y.o. [id number] whose LMP was No LMP recorded. (Menstrual status: Irregular Periods)., presents today for her annual examination.  Her menses are {norm/abn:715}, lasting {number: 22536} days.  Dysmenorrhea {dysmen:716}. She {does:18564} have intermenstrual bleeding. Hx of amenorrhea off nexpnaon/irreg menses off OCPs. Neg hormone labs 2/23, restaring OCPs anyway  Sex activity: {sex active: 315163}.  Last Pap: 01/17/21 Results were: no abnormalities /neg HPV DNA  Hx of STDs: {STD hx:14358}  There is no FH of breast cancer. There is no FH of ovarian cancer. The patient {does:18564} do self-breast exams.  Tobacco use: {tob:20664} Alcohol use: {Alcohol:11675} No drug use.  Exercise: {exercise:31265}  She {does:18564} get adequate calcium and Vitamin D in her diet.  Patient Active Problem List   Diagnosis Date Noted   Obesity (BMI 30-39.9) 06/19/2018   History of low transverse cesarean section 10/29/2017    Past Surgical History:  Procedure Laterality Date   CESAREAN SECTION N/A 05/23/2016   Procedure: CESAREAN SECTION;  Surgeon: Malachy Mood, MD;  Location: [facility];  Service: Obstetrics;  Laterality: N/A;   CESAREAN SECTION N/A 06/05/2018   Procedure: CESAREAN SECTION;  Surgeon: Gae Dry, MD;  Location: [facility];  Service: Obstetrics;  Laterality: N/A;   COLPOSCOPY  06-05-2012   ECC neg    Family History  Problem Relation Age of Onset   Tongue cancer Mother    Thyroid disease Mother    Diabetes Paternal Aunt    Diabetes Paternal Uncle    Thyroid disease Maternal Grandmother    Heart disease Maternal Grandfather    Diabetes Paternal Grandmother    Stroke Paternal Grandmother    Diabetes Paternal Grandfather     Social History   Socioeconomic History   Marital status: Married    Spouse name: Broadus John   Number of children: 1   Years of education: Not on file    Highest education level: Not on file  Occupational History   Occupation: Freight forwarder, dining services    Comment: [employer]  Tobacco Use   Smoking status: Never   Smokeless tobacco: Never  Vaping Use   Vaping Use: Never used  Substance and Sexual Activity   Alcohol use: Yes    Alcohol/week: 0.0 - 1.0 standard drinks of alcohol   Drug use: No   Sexual activity: Yes    Partners: Male    Birth control/protection: Pill  Other Topics Concern   Not on file  Social History Narrative   Not on file   Social Determinants of Health   Financial Resource Strain: Not on file  Food Insecurity: Not on file  Transportation Needs: Not on file  Physical Activity: Not on file  Stress: Not on file  Social Connections: Not on file  Intimate Partner Violence: Not on file     Current Outpatient Medications:    HAILEY 24 FE 1-20 MG-MCG(24) tablet, TAKE 1 TABLET BY MOUTH EVERY DAY, Disp: 84 tablet, Rfl: 0     ROS:  Review of Systems BREAST: No symptoms   Objective: There were no vitals taken for this visit.   OBGyn Exam  Results: No results found for this or any previous visit (from the past 24 hour(s)).  Assessment/Plan: No diagnosis found.  No orders of the defined types were placed in this encounter.  GYN counsel {counseling: 16159}     F/U  No follow-ups on file.  Nilah Belcourt B. Jamari Diana, PA-C 04/22/2022 4:57 PM

## 2022-04-23 ENCOUNTER — Encounter: Payer: Self-pay | Admitting: Obstetrics and Gynecology

## 2022-04-23 ENCOUNTER — Ambulatory Visit: Payer: 59 | Admitting: Obstetrics and Gynecology

## 2022-04-23 VITALS — BP 110/60 | Ht 61.0 in | Wt 199.0 lb

## 2022-04-23 DIAGNOSIS — Z3041 Encounter for surveillance of contraceptive pills: Secondary | ICD-10-CM

## 2022-04-23 DIAGNOSIS — Z01419 Encounter for gynecological examination (general) (routine) without abnormal findings: Secondary | ICD-10-CM | POA: Diagnosis not present

## 2022-04-23 DIAGNOSIS — Z713 Dietary counseling and surveillance: Secondary | ICD-10-CM

## 2022-04-23 MED ORDER — HAILEY 24 FE 1-20 MG-MCG(24) PO TABS: 1.0000 | ORAL_TABLET | Freq: Every day | ORAL | 3 refills | Status: DC

## 2022-04-23 NOTE — Patient Instructions (Signed)
I value your feedback and you entrusting us with your care. If you get a [place] patient survey, I would appreciate you taking the time to let us know about your experience today. Thank you! ? ? ?

## 2023-04-29 NOTE — Progress Notes (Deleted)
 PCP:  Patient, No Pcp Per   No chief complaint on file.    HPI:      [name] is a 38 y.o. [id number] whose LMP was No LMP recorded. (Menstrual status: Irregular Periods)., presents today for her annual examination.  Her menses are irregular on OCPs, lasting 3-7 days or spotting only for a few wks; don't occur on placebo pills; no late/missed pills.  No menses for 9 months after OCP start 2/23. Hx of amenorrhea off nexplanon/irreg menses off OCPs. Neg hormone labs 2/23, restarted OCPs anyway. FH PCOS.   Sex activity: single partner, contraception - vasectomy/OCP (estrogen/progesterone) for cycle control. No pain/bleeding. Last Pap: 01/17/21 Results were: no abnormalities /neg HPV DNA   There is no FH of breast cancer. There is no FH of ovarian cancer. The patient does do self-breast exams.  Tobacco use: The patient denies current or previous tobacco use. Alcohol use: none No drug use.  Exercise: min active  She does get adequate calcium but not Vitamin D in her diet.  Has lost 20# since 2/23, but was down to 177# this past fall, has gained 22# back without reason. Did extreme low calorie for wt loss, has increased cal to 1500 a day and wt returning. Hasn't done low carb.  Patient Active Problem List   Diagnosis Date Noted   Obesity (BMI 30-39.9) 06/19/2018   History of low transverse cesarean section 10/29/2017   Past Medical History:  Diagnosis Date   Abnormal Pap smear of cervix    09-17-2011 ASCUS +HPV,12/2012 ASC-US   Migraines    had until [redacted] weeks pregnant, none since   Oligomenorrhea    STD (sexually transmitted disease)    HPV    Past Surgical History:  Procedure Laterality Date   CESAREAN SECTION N/A 05/23/2016   Procedure: CESAREAN SECTION;  Surgeon: Vena Austria, MD;  Location: [facility];  Service: Obstetrics;  Laterality: N/A;   CESAREAN SECTION N/A 06/05/2018   Procedure: CESAREAN SECTION;  Surgeon: Nadara Mustard, MD;  Location: [facility];   Service: Obstetrics;  Laterality: N/A;   COLPOSCOPY  06-05-2012   ECC neg    Family History  Problem Relation Age of Onset   Tongue cancer Mother    Thyroid disease Mother    Diabetes Paternal Aunt    Diabetes Paternal Uncle    Thyroid disease Maternal Grandmother    Heart disease Maternal Grandfather    Diabetes Paternal Grandmother    Stroke Paternal Grandmother    Diabetes Paternal Grandfather     Social History   Socioeconomic History   Marital status: Married    Spouse name: Jomarie Longs   Number of children: 1   Years of education: Not on file   Highest education level: Not on file  Occupational History   Occupation: Production designer, theatre/television/film, dining services    Comment: [employer]  Tobacco Use   Smoking status: Never   Smokeless tobacco: Never  Vaping Use   Vaping status: Never Used  Substance and Sexual Activity   Alcohol use: Yes    Alcohol/week: 0.0 - 1.0 standard drinks of alcohol   Drug use: No   Sexual activity: Yes    Partners: Male    Birth control/protection: Pill  Other Topics Concern   Not on file  Social History Narrative   Not on file   Social Drivers of Health   Financial Resource Strain: Not on file  Food Insecurity: Not on file  Transportation Needs: Not on file  Physical Activity: Not on file  Stress: Not on file  Social Connections: Not on file  Intimate Partner Violence: Not on file     Current Outpatient Medications:    Norethindrone Acetate-Ethinyl Estrad-FE (HAILEY 24 FE) 1-20 MG-MCG(24) tablet, Take 1 tablet by mouth daily., Disp: 84 tablet, Rfl: 3     ROS:  Review of Systems  Constitutional:  Negative for fatigue, fever and unexpected weight change.  Respiratory:  Negative for cough, shortness of breath and wheezing.   Cardiovascular:  Negative for chest pain, palpitations and leg swelling.  Gastrointestinal:  Negative for blood in stool, constipation, diarrhea, nausea and vomiting.  Endocrine: Negative for cold intolerance, heat intolerance and  polyuria.  Genitourinary:  Negative for dyspareunia, dysuria, flank pain, frequency, genital sores, hematuria, menstrual problem, pelvic pain, urgency, vaginal bleeding, vaginal discharge and vaginal pain.  Musculoskeletal:  Negative for back pain, joint swelling and myalgias.  Skin:  Negative for rash.  Neurological:  Negative for dizziness, syncope, light-headedness, numbness and headaches.  Hematological:  Negative for adenopathy.  Psychiatric/Behavioral:  Negative for agitation, confusion, sleep disturbance and suicidal ideas. The patient is not nervous/anxious.    BREAST: No symptoms   Objective: There were no vitals taken for this visit.   Physical Exam Constitutional:      Appearance: She is well-developed.  Genitourinary:     Vulva normal.     Right Labia: No rash, tenderness or lesions.    Left Labia: No tenderness, lesions or rash.    No vaginal discharge, erythema or tenderness.      Right Adnexa: not tender and no mass present.    Left Adnexa: not tender and no mass present.    No cervical friability or polyp.     Uterus is not enlarged or tender.  Breasts:    Right: No mass, nipple discharge, skin change or tenderness.     Left: No mass, nipple discharge, skin change or tenderness.  Neck:     Thyroid: No thyromegaly.  Cardiovascular:     Rate and Rhythm: Normal rate and regular rhythm.     Heart sounds: Normal heart sounds. No murmur heard. Pulmonary:     Effort: Pulmonary effort is normal.     Breath sounds: Normal breath sounds.  Abdominal:     Palpations: Abdomen is soft.     Tenderness: There is no abdominal tenderness. There is no guarding or rebound.  Musculoskeletal:        General: Normal range of motion.     Cervical back: Normal range of motion.  Lymphadenopathy:     Cervical: No cervical adenopathy.  Neurological:     General: No focal deficit present.     Mental Status: She is alert and oriented to person, place, and time.     Cranial Nerves:  No cranial nerve deficit.  Skin:    General: Skin is warm and dry.  Psychiatric:        Mood and Affect: Mood normal.        Behavior: Behavior normal.        Thought Content: Thought content normal.        Judgment: Judgment normal.  Vitals reviewed.    Assessment/Plan: Encounter for annual routine gynecological examination  Encounter for surveillance of contraceptive pills - Plan: Norethindrone Acetate-Ethinyl Estrad-FE (HAILEY 24 FE) 1-20 MG-MCG(24) tablet; OCP RF. Discussed changing OCP brand due to irreg menses; pt ok on these for now. Could also be due to wt changes. F/u prn.  Weight loss counseling, encounter for--discussed trying low carb given suspected PCOS /increase exercise/resistance training. F/u prn.   No orders of the defined types were placed in this encounter.            GYN counsel adequate intake of calcium and vitamin D, diet and exercise     F/U  No follow-ups on file.  Jerett Odonohue B. Keyshon Stein, PA-C 04/29/2023 12:31 PM

## 2023-05-01 ENCOUNTER — Ambulatory Visit: Payer: 59 | Admitting: Obstetrics and Gynecology

## 2023-05-01 DIAGNOSIS — Z3041 Encounter for surveillance of contraceptive pills: Secondary | ICD-10-CM

## 2023-05-01 DIAGNOSIS — Z01419 Encounter for gynecological examination (general) (routine) without abnormal findings: Secondary | ICD-10-CM

## 2023-05-15 ENCOUNTER — Other Ambulatory Visit: Payer: Self-pay | Admitting: Obstetrics and Gynecology

## 2023-05-15 DIAGNOSIS — Z3041 Encounter for surveillance of contraceptive pills: Secondary | ICD-10-CM

## 2023-05-25 NOTE — Progress Notes (Unsigned)
 PCP:  Patient, No Pcp Per   No chief complaint on file.    HPI:      [name] is a 38 y.o. [id number] whose LMP was No LMP recorded. (Menstrual status: Irregular Periods)., presents today for her annual examination.  Her menses are irregular on OCPs, lasting 3-7 days or spotting only for a few wks; don't occur on placebo pills; no late/missed pills.  No menses for 9 months after OCP start 2/23. Hx of amenorrhea off nexplanon/irreg menses off OCPs. Neg hormone labs 2/23, restarted OCPs anyway. FH PCOS.   Sex activity: single partner, contraception - vasectomy/OCP (estrogen/progesterone) for cycle control. No pain/bleeding. Last Pap: 01/17/21 Results were: no abnormalities /neg HPV DNA   There is no FH of breast cancer. There is no FH of ovarian cancer. The patient does do self-breast exams.  Tobacco use: The patient denies current or previous tobacco use. Alcohol use: none No drug use.  Exercise: min active  She does get adequate calcium but not Vitamin D in her diet.  Has lost 20# since 2/23, but was down to 177# this past fall, has gained 22# back without reason. Did extreme low calorie for wt loss, has increased cal to 1500 a day and wt returning. Hasn't done low carb.  Patient Active Problem List   Diagnosis Date Noted   Obesity (BMI 30-39.9) 06/19/2018   History of low transverse cesarean section 10/29/2017   Past Medical History:  Diagnosis Date   Abnormal Pap smear of cervix    09-17-2011 ASCUS +HPV,12/2012 ASC-US   Migraines    had until [redacted] weeks pregnant, none since   Oligomenorrhea    STD (sexually transmitted disease)    HPV    Past Surgical History:  Procedure Laterality Date   CESAREAN SECTION N/A 05/23/2016   Procedure: CESAREAN SECTION;  Surgeon: Vena Austria, MD;  Location: [facility];  Service: Obstetrics;  Laterality: N/A;   CESAREAN SECTION N/A 06/05/2018   Procedure: CESAREAN SECTION;  Surgeon: Nadara Mustard, MD;  Location: [facility];   Service: Obstetrics;  Laterality: N/A;   COLPOSCOPY  06-05-2012   ECC neg    Family History  Problem Relation Age of Onset   Tongue cancer Mother    Thyroid disease Mother    Diabetes Paternal Aunt    Diabetes Paternal Uncle    Thyroid disease Maternal Grandmother    Heart disease Maternal Grandfather    Diabetes Paternal Grandmother    Stroke Paternal Grandmother    Diabetes Paternal Grandfather     Social History   Socioeconomic History   Marital status: Married    Spouse name: Jomarie Longs   Number of children: 1   Years of education: Not on file   Highest education level: Not on file  Occupational History   Occupation: Production designer, theatre/television/film, dining services    Comment: [employer]  Tobacco Use   Smoking status: Never   Smokeless tobacco: Never  Vaping Use   Vaping status: Never Used  Substance and Sexual Activity   Alcohol use: Yes    Alcohol/week: 0.0 - 1.0 standard drinks of alcohol   Drug use: No   Sexual activity: Yes    Partners: Male    Birth control/protection: Pill  Other Topics Concern   Not on file  Social History Narrative   Not on file   Social Drivers of Health   Financial Resource Strain: Not on file  Food Insecurity: Not on file  Transportation Needs: Not on file  Physical Activity: Not on file  Stress: Not on file  Social Connections: Not on file  Intimate Partner Violence: Not on file     Current Outpatient Medications:    Norethindrone Acetate-Ethinyl Estrad-FE (HAILEY 24 FE) 1-20 MG-MCG(24) tablet, TAKE 1 TABLET BY MOUTH EVERY DAY, Disp: 84 tablet, Rfl: 0     ROS:  Review of Systems  Constitutional:  Negative for fatigue, fever and unexpected weight change.  Respiratory:  Negative for cough, shortness of breath and wheezing.   Cardiovascular:  Negative for chest pain, palpitations and leg swelling.  Gastrointestinal:  Negative for blood in stool, constipation, diarrhea, nausea and vomiting.  Endocrine: Negative for cold intolerance, heat intolerance  and polyuria.  Genitourinary:  Negative for dyspareunia, dysuria, flank pain, frequency, genital sores, hematuria, menstrual problem, pelvic pain, urgency, vaginal bleeding, vaginal discharge and vaginal pain.  Musculoskeletal:  Negative for back pain, joint swelling and myalgias.  Skin:  Negative for rash.  Neurological:  Negative for dizziness, syncope, light-headedness, numbness and headaches.  Hematological:  Negative for adenopathy.  Psychiatric/Behavioral:  Negative for agitation, confusion, sleep disturbance and suicidal ideas. The patient is not nervous/anxious.    BREAST: No symptoms   Objective: There were no vitals taken for this visit.   Physical Exam Constitutional:      Appearance: She is well-developed.  Genitourinary:     Vulva normal.     Right Labia: No rash, tenderness or lesions.    Left Labia: No tenderness, lesions or rash.    No vaginal discharge, erythema or tenderness.      Right Adnexa: not tender and no mass present.    Left Adnexa: not tender and no mass present.    No cervical friability or polyp.     Uterus is not enlarged or tender.  Breasts:    Right: No mass, nipple discharge, skin change or tenderness.     Left: No mass, nipple discharge, skin change or tenderness.  Neck:     Thyroid: No thyromegaly.  Cardiovascular:     Rate and Rhythm: Normal rate and regular rhythm.     Heart sounds: Normal heart sounds. No murmur heard. Pulmonary:     Effort: Pulmonary effort is normal.     Breath sounds: Normal breath sounds.  Abdominal:     Palpations: Abdomen is soft.     Tenderness: There is no abdominal tenderness. There is no guarding or rebound.  Musculoskeletal:        General: Normal range of motion.     Cervical back: Normal range of motion.  Lymphadenopathy:     Cervical: No cervical adenopathy.  Neurological:     General: No focal deficit present.     Mental Status: She is alert and oriented to person, place, and time.     Cranial  Nerves: No cranial nerve deficit.  Skin:    General: Skin is warm and dry.  Psychiatric:        Mood and Affect: Mood normal.        Behavior: Behavior normal.        Thought Content: Thought content normal.        Judgment: Judgment normal.  Vitals reviewed.    Assessment/Plan: Encounter for annual routine gynecological examination  Encounter for surveillance of contraceptive pills - Plan: Norethindrone Acetate-Ethinyl Estrad-FE (HAILEY 24 FE) 1-20 MG-MCG(24) tablet; OCP RF. Discussed changing OCP brand due to irreg menses; pt ok on these for now. Could also be due to wt changes. F/u prn.  Weight loss counseling, encounter for--discussed trying low carb given suspected PCOS /increase exercise/resistance training. F/u prn.   No orders of the defined types were placed in this encounter.            GYN counsel adequate intake of calcium and vitamin D, diet and exercise     F/U  No follow-ups on file.  Asiya Cutbirth B. Juli Odom, PA-C 05/25/2023 6:04 PM

## 2023-05-26 ENCOUNTER — Encounter: Payer: Self-pay | Admitting: Obstetrics and Gynecology

## 2023-05-26 ENCOUNTER — Ambulatory Visit (INDEPENDENT_AMBULATORY_CARE_PROVIDER_SITE_OTHER): Payer: 59 | Admitting: Obstetrics and Gynecology

## 2023-05-26 VITALS — BP 133/80 | HR 108 | Ht 61.0 in | Wt 188.0 lb

## 2023-05-26 DIAGNOSIS — Z01419 Encounter for gynecological examination (general) (routine) without abnormal findings: Secondary | ICD-10-CM

## 2023-05-26 DIAGNOSIS — Z3041 Encounter for surveillance of contraceptive pills: Secondary | ICD-10-CM

## 2023-05-26 MED ORDER — HAILEY 24 FE 1-20 MG-MCG(24) PO TABS: 1.0000 | ORAL_TABLET | Freq: Every day | ORAL | 3 refills | Status: AC

## 2023-05-26 NOTE — Patient Instructions (Signed)
 I value your feedback and you entrusting [place] with your care. If you get a [place] patient survey, I would appreciate you taking the time to let us know about your experience today. Thank you! ? ? ?
